# Patient Record
Sex: Male | Born: 1950 | Race: White | Hispanic: No | Marital: Married | Smoking: Former smoker
Health system: Southern US, Community
[De-identification: ages and names within clinical notes are randomized; demographics above are authoritative.]

## PROBLEM LIST (undated history)

## (undated) DIAGNOSIS — R600 Localized edema: Secondary | ICD-10-CM

## (undated) DIAGNOSIS — G43909 Migraine, unspecified, not intractable, without status migrainosus: Secondary | ICD-10-CM

## (undated) DIAGNOSIS — F419 Anxiety disorder, unspecified: Secondary | ICD-10-CM

## (undated) DIAGNOSIS — G4733 Obstructive sleep apnea (adult) (pediatric): Secondary | ICD-10-CM

## (undated) DIAGNOSIS — D649 Anemia, unspecified: Secondary | ICD-10-CM

## (undated) DIAGNOSIS — E039 Hypothyroidism, unspecified: Secondary | ICD-10-CM

## (undated) DIAGNOSIS — D696 Thrombocytopenia, unspecified: Secondary | ICD-10-CM

## (undated) DIAGNOSIS — K7210 Chronic hepatic failure without coma: Secondary | ICD-10-CM

## (undated) DIAGNOSIS — K7290 Hepatic failure, unspecified without coma: Secondary | ICD-10-CM

## (undated) DIAGNOSIS — R0602 Shortness of breath: Secondary | ICD-10-CM

## (undated) DIAGNOSIS — F329 Major depressive disorder, single episode, unspecified: Secondary | ICD-10-CM

## (undated) DIAGNOSIS — F1010 Alcohol abuse, uncomplicated: Secondary | ICD-10-CM

## (undated) DIAGNOSIS — M109 Gout, unspecified: Secondary | ICD-10-CM

## (undated) DIAGNOSIS — R188 Other ascites: Secondary | ICD-10-CM

## (undated) DIAGNOSIS — I10 Essential (primary) hypertension: Secondary | ICD-10-CM

## (undated) DIAGNOSIS — R51 Headache: Secondary | ICD-10-CM

## (undated) DIAGNOSIS — R519 Headache, unspecified: Secondary | ICD-10-CM

## (undated) DIAGNOSIS — K7460 Unspecified cirrhosis of liver: Secondary | ICD-10-CM

## (undated) DIAGNOSIS — F32A Depression, unspecified: Secondary | ICD-10-CM

## (undated) HISTORY — DX: Other ascites: R18.8

## (undated) HISTORY — PX: US THORA/PARACENTESIS: HXRAD580

## (undated) HISTORY — DX: Thrombocytopenia, unspecified: D69.6

## (undated) HISTORY — DX: Morbid (severe) obesity due to excess calories: E66.01

## (undated) HISTORY — DX: Chronic hepatic failure without coma: K72.10

## (undated) HISTORY — DX: Obstructive sleep apnea (adult) (pediatric): G47.33

## (undated) HISTORY — DX: Hepatic failure, unspecified without coma: K72.90

## (undated) HISTORY — DX: Gout, unspecified: M10.9

## (undated) HISTORY — DX: Localized edema: R60.0

## (undated) HISTORY — DX: Alcohol abuse, uncomplicated: F10.10

## (undated) HISTORY — DX: Anemia, unspecified: D64.9

## (undated) HISTORY — DX: Essential (primary) hypertension: I10

## (undated) HISTORY — PX: LAPAROSCOPIC GASTRIC BANDING: SHX1100

## (undated) HISTORY — DX: Unspecified cirrhosis of liver: K74.60

---

## 1970-02-03 HISTORY — PX: HUMERUS FRACTURE SURGERY: SHX670

## 1980-09-03 HISTORY — PX: KNEE ARTHROSCOPY: SHX127

## 2000-07-02 ENCOUNTER — Encounter: Payer: Self-pay | Admitting: Emergency Medicine

## 2000-07-02 ENCOUNTER — Emergency Department (HOSPITAL_COMMUNITY): Admission: EM | Admit: 2000-07-02 | Discharge: 2000-07-02 | Payer: Self-pay | Admitting: Emergency Medicine

## 2010-03-07 ENCOUNTER — Ambulatory Visit (HOSPITAL_COMMUNITY): Payer: Self-pay

## 2011-06-04 HISTORY — PX: PATELLA FRACTURE SURGERY: SHX735

## 2012-10-22 ENCOUNTER — Other Ambulatory Visit: Payer: Self-pay | Admitting: *Deleted

## 2012-10-22 MED ORDER — OXYCODONE HCL 5 MG PO TABS: ORAL_TABLET | ORAL | Status: DC

## 2012-10-22 MED ORDER — ZOLPIDEM TARTRATE 5 MG PO TABS: 5.0000 mg | ORAL_TABLET | Freq: Every evening | ORAL | Status: DC | PRN

## 2012-10-23 ENCOUNTER — Non-Acute Institutional Stay (SKILLED_NURSING_FACILITY): Payer: PRIVATE HEALTH INSURANCE | Admitting: Internal Medicine

## 2012-10-23 DIAGNOSIS — K7460 Unspecified cirrhosis of liver: Secondary | ICD-10-CM

## 2012-10-23 DIAGNOSIS — R609 Edema, unspecified: Secondary | ICD-10-CM

## 2012-10-23 DIAGNOSIS — R188 Other ascites: Secondary | ICD-10-CM

## 2012-10-25 ENCOUNTER — Non-Acute Institutional Stay (SKILLED_NURSING_FACILITY): Payer: PRIVATE HEALTH INSURANCE | Admitting: Internal Medicine

## 2012-10-25 ENCOUNTER — Non-Acute Institutional Stay: Payer: PRIVATE HEALTH INSURANCE | Admitting: Internal Medicine

## 2012-10-25 DIAGNOSIS — R109 Unspecified abdominal pain: Secondary | ICD-10-CM

## 2012-10-25 DIAGNOSIS — R188 Other ascites: Secondary | ICD-10-CM

## 2012-10-25 DIAGNOSIS — L0390 Cellulitis, unspecified: Secondary | ICD-10-CM

## 2012-10-25 DIAGNOSIS — K7460 Unspecified cirrhosis of liver: Secondary | ICD-10-CM

## 2012-10-25 DIAGNOSIS — R609 Edema, unspecified: Secondary | ICD-10-CM

## 2012-10-26 ENCOUNTER — Other Ambulatory Visit: Payer: Self-pay

## 2012-10-26 MED ORDER — OXYCODONE HCL 5 MG PO TABS: ORAL_TABLET | ORAL | Status: DC

## 2012-10-26 NOTE — Telephone Encounter (Signed)
Verified dose and instructions reflect manual request received by nursing home.   

## 2012-10-29 NOTE — Progress Notes (Addendum)
Patient ID: Shawn Wallace, male   DOB: 1950/02/23, 62 y.o.   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 He was admitted with abdominal pain and distention. He had previously had a paracentesis in April 2014. He underwent a therapeutic non-diagnostic abdominal paracentesis again on September 10th. Eleven liters of fluid were removed, followed by albumin infusion. He tolerated this well. He was started on lactulose and Lasix. He was put on sodium restriction. He followed up with GI, who did a repeat large volume paracentesis with 15 L of fluid removed on September 7th. His aldactone was increased to 100 and Lasix 40 mg.   PAST MEDICAL HISTORY/PROBLEM LIST:  Acute-on-chronic ascites secondary to end-stage liver disease, status post paracentesis x2.  Liver cirrhosis, likely secondary to chronic alcohol abuse.  Lower extremity edema, status post IV diuretic use.  History of gout.  History of hypertension.  Morbid obesity.  Obstructive sleep apnea.  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  Anemia of chronic disease.  CURRENT MEDICATIONS:  Discharge medications include:   Lasix 40 a day.   Lactulose 15 cc b.i.d.   Propranolol 80 b.i.d.   Spironolactone 100 daily.   Allopurinol 300 mg daily (presumably gout).   Ferrous sulfate 325 b.i.d.   Synthroid 25 a day.   Omeprazole 20 b.i.d.   Zoloft 200 a day.   Zolpidem 5 mg q.h.s. p.r.n.   Oxycodone 5 mg, 1 tablet daily p.r.n.   Amlodipine 5 mg daily.     SOCIAL HISTORY: HOUSING: The patient tells me he lives in [place] with family members.  FUNCTIONAL STATUS: He is able to stand and walk.   FAMILY HISTORY:  None available.    REVIEW OF SYSTEMS:   CHEST/RESPIRATORY: Some exertional shortness  of breath.  CARDIAC: No chest pain.  GI: His abdomen is somewhat sore, he says, but nausea or vomiting.  GU: No dysuria.   PHYSICAL EXAMINATION:   GENERAL APPEARANCE: The patient is not in any distress.  HEENT:  MOUTH/THROAT: A few remaining teeth. No lesions.  CHEST/RESPIRATORY: Clear air entry bilaterally.  CARDIOVASCULAR:  CARDIAC: Heart sounds are normal. There are no murmurs.  GASTROINTESTINAL:  ABDOMEN: Noticeably distended. He probably does have some remaining ascites, although I think this partially might be an ileus. HERNIA:  He also has a large ventral hernia.    GENITOURINARY:  Not distended.  There is no CVA tenderness.   EDEMA/VARICOSITIES:  No edema.  No evidence of heart failure.     ASSESSMENT/PLAN:  End-stage liver disease with cirrhosis.   He does not have any other findings at this point other than massive abdominal distention.  As mentioned, I think he has some ascites remaining.  However, I would not be surprised if he has a chronic ileus.  His abdomen is distended, but nontender.   Noted are the complications of portal hypertension including esophageal varices, history of encephalopathy, etc.  Lower extremity edema.  I would agree with the discharging physician.  He appears to be euvolemic.  He will need to be weighed frequently and his diuretics adjusted accordingly.   He is on No Added Salt diet here.  That may not be sufficient, although I think it is the best we  can do.    Hypothyroidism.  On replacement.     The patient does not appear to be encephalopathic at present.  He has no peripheral edema, although I think he is reaccumulating ascites.  We will check his lab work for next week.    CPT CODE: 16109

## 2012-11-02 ENCOUNTER — Encounter: Payer: Self-pay | Admitting: Adult Health

## 2012-11-02 ENCOUNTER — Non-Acute Institutional Stay (SKILLED_NURSING_FACILITY): Payer: PRIVATE HEALTH INSURANCE | Admitting: Adult Health

## 2012-11-02 DIAGNOSIS — M109 Gout, unspecified: Secondary | ICD-10-CM

## 2012-11-02 DIAGNOSIS — R609 Edema, unspecified: Secondary | ICD-10-CM

## 2012-11-02 DIAGNOSIS — F329 Major depressive disorder, single episode, unspecified: Secondary | ICD-10-CM

## 2012-11-02 DIAGNOSIS — G8929 Other chronic pain: Secondary | ICD-10-CM | POA: Insufficient documentation

## 2012-11-02 DIAGNOSIS — D638 Anemia in other chronic diseases classified elsewhere: Secondary | ICD-10-CM | POA: Insufficient documentation

## 2012-11-02 DIAGNOSIS — K7460 Unspecified cirrhosis of liver: Secondary | ICD-10-CM

## 2012-11-02 DIAGNOSIS — K7290 Hepatic failure, unspecified without coma: Secondary | ICD-10-CM | POA: Insufficient documentation

## 2012-11-02 DIAGNOSIS — K219 Gastro-esophageal reflux disease without esophagitis: Secondary | ICD-10-CM | POA: Insufficient documentation

## 2012-11-02 DIAGNOSIS — R188 Other ascites: Secondary | ICD-10-CM

## 2012-11-02 DIAGNOSIS — I10 Essential (primary) hypertension: Secondary | ICD-10-CM

## 2012-11-02 DIAGNOSIS — K769 Liver disease, unspecified: Secondary | ICD-10-CM

## 2012-11-02 DIAGNOSIS — E039 Hypothyroidism, unspecified: Secondary | ICD-10-CM

## 2012-11-02 NOTE — Assessment & Plan Note (Signed)
He has 3-4+ pitting edema bilaterally present; will increase his lasix to 40 mg twice daily and will monitor his status

## 2012-11-02 NOTE — Assessment & Plan Note (Signed)
Will continue iron twice daily and will monitor

## 2012-11-02 NOTE — Assessment & Plan Note (Signed)
Will continue allopurinol 300 mg daily

## 2012-11-02 NOTE — Assessment & Plan Note (Signed)
His emotional state is stable will continue his zoloft 200 mg daily

## 2012-11-02 NOTE — Assessment & Plan Note (Signed)
He continues to have bilateral shoulder pain and bilateral lower extremity pain will change his oxycodone to 5 mg every 6 hours for one week then every 6 hours as needed and will monitor his status

## 2012-11-02 NOTE — Assessment & Plan Note (Signed)
He is without change in status; will continue his lactulose 15 cc twice daily and will monitor his ammonia level

## 2012-11-02 NOTE — Assessment & Plan Note (Signed)
Will continue synthroid 25 mcg daily

## 2012-11-02 NOTE — Assessment & Plan Note (Signed)
Will continue prilosec 20 mg twice daily

## 2012-11-02 NOTE — Progress Notes (Signed)
Patient ID: Shawn Wallace, male   DOB: Jan 20, 1951, 62 y.o.   MRN: [id number]  [facility]  Allergies  Allergen Reactions  . Penicillins      Chief Complaint  Patient presents with  . Acute Visit    pain management     HPI: He is being seen for the management of his chronic pain. He is having increase bilateral shoulder pain and bilateral lower extremity pain. He states the oxycodone does help with his pain relief but is not available often enough. He states prior to his hospitalization he was trying to cut back on the use of his oxycodone.   Past Medical History  Diagnosis Date  . Ascites   . End-stage liver disease   . Liver cirrhosis   . Chronic alcohol abuse   . Edema extremities   . Gout   . Hypertension   . Morbid obesity   . Obstructive sleep apnea   . Thrombocytopenia   . Anemia     Past Surgical History  Procedure Laterality Date  . [place] thora/paracentesis      11 liters    VITAL SIGNS BP 120/68  Pulse 68  Ht 5\' 11"  (1.803 m)  Wt 270 lb (122.471 kg)  BMI 37.67 kg/m2   Patient's Medications  New Prescriptions   No medications on file  Previous Medications   AMLODIPINE (NORVASC) 5 MG TABLET    Take 5 mg by mouth daily.   FERROUS GLUCONATE (FERGON) 325 MG TABLET    Take 325 mg by mouth 2 (two) times daily.    FUROSEMIDE (LASIX) 40 MG TABLET    Take 40 mg by mouth daily.   LACTULOSE, ENCEPHALOPATHY, (CHRONULAC) 10 GM/15ML SOLN    Take 10 g by mouth 2 (two) times daily.   LEVOTHYROXINE (SYNTHROID, LEVOTHROID) 25 MCG TABLET    Take 25 mcg by mouth daily before breakfast.   OMEPRAZOLE (PRILOSEC) 20 MG CAPSULE    Take 20 mg by mouth 2 (two) times daily.   OXYCODONE (OXY IR/ROXICODONE) 5 MG IMMEDIATE RELEASE TABLET    1 by mouth twice a day as needed for pain   PROPRANOLOL (INDERAL) 80 MG TABLET    Take 80 mg by mouth 2 (two) times daily.   SERTRALINE (ZOLOFT) 100 MG TABLET    Take 200 mg by mouth daily.   SPIRONOLACTONE (ALDACTONE) 100 MG TABLET    Take 100  mg by mouth daily.   ZOLPIDEM (AMBIEN) 5 MG TABLET    Take 1 tablet (5 mg total) by mouth at bedtime as needed for sleep.  Modified Medications   No medications on file  Discontinued Medications   No medications on file    SIGNIFICANT DIAGNOSTIC EXAMS    LABS REVIEWED:   10-28-12: wbc 4.7; hgb 11.4; hct 38.7; mcv 87; plt 92; glucose 75; bun 15; creat 0.87; k+4.1; na++137    Review of Systems  Constitutional: Positive for malaise/fatigue.  Respiratory: Negative for cough and shortness of breath.   Cardiovascular: Positive for leg swelling. Negative for chest pain.  Gastrointestinal: Negative for heartburn, abdominal pain and constipation.  Musculoskeletal: Positive for myalgias, back pain and joint pain.  Skin: Negative.   Neurological: Negative for headaches.  Psychiatric/Behavioral: Negative for depression. The patient does not have insomnia.     Physical Exam  Constitutional: He is oriented to person, place, and time. He appears well-developed and well-nourished.  Morbidly obese  Neck: No JVD present. No thyromegaly present.  Cardiovascular: Normal rate, regular  rhythm and intact distal pulses.   Respiratory: Effort normal and breath sounds normal. No respiratory distress.  GI: Soft. Bowel sounds are normal.  Has severe ascites present   Musculoskeletal: Normal range of motion. He exhibits edema.  Has 3-4+ bilateral lower extremity pitting edema present   Neurological: He is alert and oriented to person, place, and time.  Skin: Skin is warm and dry.  Psychiatric: He has a normal mood and affect.      ASSESSMENT/ PLAN:  Essential hypertension, benign He is stable will continue norvasc 5 mg daily and propranolol 80 mg twice daily and will monitor his status  GERD (gastroesophageal reflux disease) Will continue prilosec 20 mg twice daily   End-stage liver disease He is without change in status; will continue his lactulose 15 cc twice daily and will monitor his  ammonia level   Hypothyroidism Will continue synthroid 25 mcg daily   Gout Will continue allopurinol 300 mg daily   Edema He has 3-4+ pitting edema bilaterally present; will increase his lasix to 40 mg twice daily and will monitor his status   Depression His emotional state is stable will continue his zoloft 200 mg daily   Chronic pain He continues to have bilateral shoulder pain and bilateral lower extremity pain will change his oxycodone to 5 mg every 6 hours for one week then every 6 hours as needed and will monitor his status   Ascites He continue to have severe ascites; will continue his spironolactone 100 mg daily and will monitor his status   Anemia of chronic disease Will continue iron twice daily and will monitor    in one week will check bmp; ammonia; tsh  Time spent with patient 50 minutes.

## 2012-11-02 NOTE — Assessment & Plan Note (Signed)
He continue to have severe ascites; will continue his spironolactone 100 mg daily and will monitor his status

## 2012-11-02 NOTE — Assessment & Plan Note (Signed)
He is stable will continue norvasc 5 mg daily and propranolol 80 mg twice daily and will monitor his status

## 2012-11-08 ENCOUNTER — Non-Acute Institutional Stay (SKILLED_NURSING_FACILITY): Payer: PRIVATE HEALTH INSURANCE | Admitting: Internal Medicine

## 2012-11-08 DIAGNOSIS — K7460 Unspecified cirrhosis of liver: Secondary | ICD-10-CM

## 2012-11-08 DIAGNOSIS — D638 Anemia in other chronic diseases classified elsewhere: Secondary | ICD-10-CM

## 2012-11-08 DIAGNOSIS — E039 Hypothyroidism, unspecified: Secondary | ICD-10-CM

## 2012-11-08 DIAGNOSIS — G8929 Other chronic pain: Secondary | ICD-10-CM

## 2012-11-09 ENCOUNTER — Encounter (HOSPITAL_COMMUNITY): Payer: Self-pay | Admitting: General Practice

## 2012-11-09 ENCOUNTER — Inpatient Hospital Stay (HOSPITAL_COMMUNITY)
Admission: EM | Admit: 2012-11-09 | Discharge: 2012-11-14 | DRG: 948 | Disposition: A | Discharge status: Skilled Nursing Facility | Payer: Non-veteran care | Attending: Internal Medicine | Admitting: Internal Medicine

## 2012-11-09 DIAGNOSIS — F3289 Other specified depressive episodes: Secondary | ICD-10-CM | POA: Diagnosis present

## 2012-11-09 DIAGNOSIS — I4891 Unspecified atrial fibrillation: Secondary | ICD-10-CM | POA: Diagnosis not present

## 2012-11-09 DIAGNOSIS — D696 Thrombocytopenia, unspecified: Secondary | ICD-10-CM | POA: Diagnosis present

## 2012-11-09 DIAGNOSIS — R7989 Other specified abnormal findings of blood chemistry: Secondary | ICD-10-CM

## 2012-11-09 DIAGNOSIS — M109 Gout, unspecified: Secondary | ICD-10-CM | POA: Diagnosis present

## 2012-11-09 DIAGNOSIS — I480 Paroxysmal atrial fibrillation: Secondary | ICD-10-CM

## 2012-11-09 DIAGNOSIS — K7030 Alcoholic cirrhosis of liver without ascites: Secondary | ICD-10-CM | POA: Diagnosis present

## 2012-11-09 DIAGNOSIS — R609 Edema, unspecified: Secondary | ICD-10-CM

## 2012-11-09 DIAGNOSIS — L03116 Cellulitis of left lower limb: Secondary | ICD-10-CM

## 2012-11-09 DIAGNOSIS — K7290 Hepatic failure, unspecified without coma: Secondary | ICD-10-CM

## 2012-11-09 DIAGNOSIS — F1020 Alcohol dependence, uncomplicated: Secondary | ICD-10-CM | POA: Diagnosis present

## 2012-11-09 DIAGNOSIS — G8929 Other chronic pain: Secondary | ICD-10-CM

## 2012-11-09 DIAGNOSIS — L0390 Cellulitis, unspecified: Secondary | ICD-10-CM

## 2012-11-09 DIAGNOSIS — F411 Generalized anxiety disorder: Secondary | ICD-10-CM | POA: Diagnosis present

## 2012-11-09 DIAGNOSIS — Z87891 Personal history of nicotine dependence: Secondary | ICD-10-CM

## 2012-11-09 DIAGNOSIS — K7460 Unspecified cirrhosis of liver: Secondary | ICD-10-CM

## 2012-11-09 DIAGNOSIS — F329 Major depressive disorder, single episode, unspecified: Secondary | ICD-10-CM | POA: Diagnosis present

## 2012-11-09 DIAGNOSIS — L02619 Cutaneous abscess of unspecified foot: Secondary | ICD-10-CM

## 2012-11-09 DIAGNOSIS — Z23 Encounter for immunization: Secondary | ICD-10-CM

## 2012-11-09 DIAGNOSIS — K769 Liver disease, unspecified: Secondary | ICD-10-CM | POA: Diagnosis present

## 2012-11-09 DIAGNOSIS — G4733 Obstructive sleep apnea (adult) (pediatric): Secondary | ICD-10-CM | POA: Diagnosis present

## 2012-11-09 DIAGNOSIS — I10 Essential (primary) hypertension: Secondary | ICD-10-CM | POA: Diagnosis present

## 2012-11-09 DIAGNOSIS — D638 Anemia in other chronic diseases classified elsewhere: Secondary | ICD-10-CM | POA: Diagnosis present

## 2012-11-09 DIAGNOSIS — E039 Hypothyroidism, unspecified: Secondary | ICD-10-CM | POA: Diagnosis present

## 2012-11-09 DIAGNOSIS — R188 Other ascites: Principal | ICD-10-CM

## 2012-11-09 DIAGNOSIS — F1021 Alcohol dependence, in remission: Secondary | ICD-10-CM

## 2012-11-09 DIAGNOSIS — G43909 Migraine, unspecified, not intractable, without status migrainosus: Secondary | ICD-10-CM | POA: Diagnosis present

## 2012-11-09 HISTORY — DX: Shortness of breath: R06.02

## 2012-11-09 HISTORY — DX: Headache: R51

## 2012-11-09 HISTORY — DX: Hypothyroidism, unspecified: E03.9

## 2012-11-09 HISTORY — DX: Depression, unspecified: F32.A

## 2012-11-09 HISTORY — DX: Headache, unspecified: R51.9

## 2012-11-09 HISTORY — DX: Migraine, unspecified, not intractable, without status migrainosus: G43.909

## 2012-11-09 HISTORY — DX: Major depressive disorder, single episode, unspecified: F32.9

## 2012-11-09 HISTORY — DX: Anxiety disorder, unspecified: F41.9

## 2012-11-09 LAB — BASIC METABOLIC PANEL
CO2: 29 mEq/L (ref 19–32)
Calcium: 8.5 mg/dL (ref 8.4–10.5)
Chloride: 99 mEq/L (ref 96–112)
Creatinine, Ser: 0.89 mg/dL (ref 0.50–1.35)
GFR calc non Af Amer: 89 mL/min — ABNORMAL LOW (ref >90)
Glucose, Bld: 77 mg/dL (ref 70–99)
Potassium: 4 mEq/L (ref 3.5–5.1)

## 2012-11-09 LAB — CBC WITH DIFFERENTIAL/PLATELET
Basophils Relative: 2 % — ABNORMAL HIGH (ref 0–1)
Eosinophils Absolute: 0.2 10*3/uL (ref 0.0–0.7)
HCT: 34.7 % — ABNORMAL LOW (ref 39.0–52.0)
Hemoglobin: 11.2 g/dL — ABNORMAL LOW (ref 13.0–17.0)
Lymphs Abs: 0.6 10*3/uL — ABNORMAL LOW (ref 0.7–4.0)
MCH: 28 pg (ref 26.0–34.0)
MCHC: 32.3 g/dL (ref 30.0–36.0)
MCV: 86.8 fL (ref 78.0–100.0)
Monocytes Absolute: 0.6 10*3/uL (ref 0.1–1.0)
Monocytes Relative: 13 % — ABNORMAL HIGH (ref 3–12)
RBC: 4 MIL/uL — ABNORMAL LOW (ref 4.22–5.81)

## 2012-11-09 LAB — APTT: aPTT: 33 seconds (ref 24–37)

## 2012-11-09 LAB — CG4 I-STAT (LACTIC ACID): Lactic Acid, Venous: 1.39 mmol/L (ref 0.5–2.2)

## 2012-11-09 LAB — HEPATIC FUNCTION PANEL
ALT: 16 U/L (ref 0–53)
Albumin: 2.8 g/dL — ABNORMAL LOW (ref 3.5–5.2)
Alkaline Phosphatase: 98 U/L (ref 39–117)
Indirect Bilirubin: 0.5 mg/dL (ref 0.3–0.9)
Total Bilirubin: 0.7 mg/dL (ref 0.3–1.2)
Total Protein: 6.5 g/dL (ref 6.0–8.3)

## 2012-11-09 LAB — POCT I-STAT TROPONIN I

## 2012-11-09 LAB — PROTIME-INR
INR: 0.99 (ref 0.00–1.49)
Prothrombin Time: 12.9 seconds (ref 11.6–15.2)

## 2012-11-09 LAB — AMMONIA: Ammonia: 71 umol/L — ABNORMAL HIGH (ref 11–60)

## 2012-11-09 MED ORDER — AMLODIPINE BESYLATE 5 MG PO TABS
5.0000 mg | ORAL_TABLET | Freq: Every day | ORAL | Status: DC
  Administered 2012-11-10 – 2012-11-11 (×2): 5 mg via ORAL
  Filled 2012-11-09 (×3): qty 1

## 2012-11-09 MED ORDER — VANCOMYCIN HCL IN DEXTROSE 1-5 GM/200ML-% IV SOLN
1000.0000 mg | Freq: Once | INTRAVENOUS | Status: AC
  Administered 2012-11-09: 1000 mg via INTRAVENOUS
  Filled 2012-11-09: qty 200

## 2012-11-09 MED ORDER — CIPROFLOXACIN IN D5W 400 MG/200ML IV SOLN
400.0000 mg | Freq: Once | INTRAVENOUS | Status: AC
  Administered 2012-11-09: 400 mg via INTRAVENOUS
  Filled 2012-11-09: qty 200

## 2012-11-09 MED ORDER — FUROSEMIDE 40 MG PO TABS
40.0000 mg | ORAL_TABLET | Freq: Every day | ORAL | Status: DC
  Administered 2012-11-10 – 2012-11-11 (×2): 40 mg via ORAL
  Filled 2012-11-09 (×3): qty 1

## 2012-11-09 MED ORDER — ACETAMINOPHEN 325 MG PO TABS
650.0000 mg | ORAL_TABLET | Freq: Four times a day (QID) | ORAL | Status: DC | PRN
Start: 2012-11-09 — End: 2012-11-14
  Filled 2012-11-09: qty 2

## 2012-11-09 MED ORDER — FERROUS GLUCONATE 324 (38 FE) MG PO TABS
325.0000 mg | ORAL_TABLET | Freq: Two times a day (BID) | ORAL | Status: DC
  Administered 2012-11-10 (×2): 324 mg via ORAL
  Administered 2012-11-11 – 2012-11-14 (×7): 325 mg via ORAL
  Filled 2012-11-09 (×11): qty 1

## 2012-11-09 MED ORDER — SODIUM CHLORIDE 0.9 % IJ SOLN
3.0000 mL | Freq: Two times a day (BID) | INTRAMUSCULAR | Status: DC
  Administered 2012-11-10: 3 mL via INTRAVENOUS

## 2012-11-09 MED ORDER — ONDANSETRON HCL 4 MG/2ML IJ SOLN: 4.0000 mg | Freq: Three times a day (TID) | INTRAMUSCULAR | Status: DC | PRN

## 2012-11-09 MED ORDER — OXYCODONE HCL 5 MG PO TABS
5.0000 mg | ORAL_TABLET | ORAL | Status: DC | PRN
  Administered 2012-11-09 (×2): 5 mg via ORAL
  Filled 2012-11-09 (×2): qty 1

## 2012-11-09 MED ORDER — SODIUM CHLORIDE 0.9 % IJ SOLN: 3.0000 mL | Freq: Two times a day (BID) | INTRAMUSCULAR | Status: DC

## 2012-11-09 MED ORDER — ONDANSETRON HCL 4 MG/2ML IJ SOLN: 4.0000 mg | Freq: Four times a day (QID) | INTRAMUSCULAR | Status: DC | PRN

## 2012-11-09 MED ORDER — PROPRANOLOL HCL 80 MG PO TABS
80.0000 mg | ORAL_TABLET | Freq: Two times a day (BID) | ORAL | Status: DC
  Administered 2012-11-10 – 2012-11-14 (×9): 80 mg via ORAL
  Filled 2012-11-09 (×12): qty 1

## 2012-11-09 MED ORDER — INFLUENZA VAC SPLIT QUAD 0.5 ML IM SUSP
0.5000 mL | INTRAMUSCULAR | Status: AC
  Administered 2012-11-10: 0.5 mL via INTRAMUSCULAR
  Filled 2012-11-09: qty 0.5

## 2012-11-09 MED ORDER — ACETAMINOPHEN 650 MG RE SUPP: 650.0000 mg | Freq: Four times a day (QID) | RECTAL | Status: DC | PRN

## 2012-11-09 MED ORDER — LACTULOSE 10 GM/15ML PO SOLN
20.0000 g | Freq: Three times a day (TID) | ORAL | Status: DC
  Administered 2012-11-10 – 2012-11-14 (×14): 20 g via ORAL
  Filled 2012-11-09 (×16): qty 30

## 2012-11-09 MED ORDER — SERTRALINE HCL 100 MG PO TABS
200.0000 mg | ORAL_TABLET | Freq: Every day | ORAL | Status: DC
  Administered 2012-11-10 – 2012-11-14 (×5): 200 mg via ORAL
  Filled 2012-11-09 (×5): qty 2

## 2012-11-09 MED ORDER — OXYCODONE HCL 5 MG PO TABS
5.0000 mg | ORAL_TABLET | Freq: Four times a day (QID) | ORAL | Status: DC | PRN
  Administered 2012-11-10 – 2012-11-14 (×17): 5 mg via ORAL
  Filled 2012-11-09 (×17): qty 1

## 2012-11-09 MED ORDER — SPIRONOLACTONE 100 MG PO TABS
100.0000 mg | ORAL_TABLET | Freq: Every day | ORAL | Status: DC
  Administered 2012-11-10 – 2012-11-11 (×2): 100 mg via ORAL
  Filled 2012-11-09 (×3): qty 1

## 2012-11-09 MED ORDER — ONDANSETRON HCL 4 MG PO TABS: 4.0000 mg | ORAL_TABLET | Freq: Four times a day (QID) | ORAL | Status: DC | PRN

## 2012-11-09 MED ORDER — LACTULOSE 10 GM/15ML PO SOLN
20.0000 g | Freq: Once | ORAL | Status: AC
  Administered 2012-11-09: 20 g via ORAL
  Filled 2012-11-09: qty 30

## 2012-11-09 MED ORDER — LEVOTHYROXINE SODIUM 25 MCG PO TABS
25.0000 ug | ORAL_TABLET | Freq: Every day | ORAL | Status: DC
  Administered 2012-11-10 – 2012-11-14 (×5): 25 ug via ORAL
  Filled 2012-11-09 (×6): qty 1

## 2012-11-09 MED ORDER — PANTOPRAZOLE SODIUM 40 MG PO TBEC
40.0000 mg | DELAYED_RELEASE_TABLET | Freq: Every day | ORAL | Status: DC
  Administered 2012-11-10 – 2012-11-14 (×5): 40 mg via ORAL
  Filled 2012-11-09 (×5): qty 1

## 2012-11-09 MED ORDER — ZOLPIDEM TARTRATE 5 MG PO TABS
5.0000 mg | ORAL_TABLET | Freq: Every evening | ORAL | Status: DC | PRN
  Administered 2012-11-11: 5 mg via ORAL
  Filled 2012-11-09: qty 1

## 2012-11-09 NOTE — ED Provider Notes (Signed)
See prior note   Ward Givens, MD 11/09/12 2025

## 2012-11-09 NOTE — Progress Notes (Signed)
ANTIBIOTIC CONSULT NOTE - INITIAL  Pharmacy Consult for Vancomycin and Cipro Indication: cellulitis and ascites  Allergies  Allergen Reactions  . Penicillins Hives    Patient Measurements: Height: 5\' 11"  (180.3 cm) Weight: 324 lb 12.8 oz (147.328 kg) IBW/kg (Calculated) : 75.3 Adjusted Body Weight: 105 kg  Vital Signs: Temp: 98.5 F (36.9 C) (10/07 2122) Temp src: Oral (10/07 2122) BP: 124/66 mmHg (10/07 2122) Pulse Rate: 64 (10/07 2122) Intake/Output from previous day:   Intake/Output from this shift: Total I/O In: 200 [IV Piggyback:200] Out: -   Labs:  Recent Labs  11/09/12 1657  WBC 4.7  HGB 11.2*  PLT 76*  CREATININE 0.89   Estimated Creatinine Clearance: 125.1 ml/min (by C-G formula based on Cr of 0.89). No results found for this basename: VANCOTROUGH, VANCOPEAK, VANCORANDOM, GENTTROUGH, GENTPEAK, GENTRANDOM, TOBRATROUGH, TOBRAPEAK, TOBRARND, AMIKACINPEAK, AMIKACINTROU, AMIKACIN,  in the last 72 hours   Microbiology: No results found for this or any previous visit (from the past 720 hour(s)).  Medical History: Past Medical History  Diagnosis Date  . Ascites   . End-stage liver disease   . Liver cirrhosis   . Chronic alcohol abuse   . Edema extremities   . Gout   . Hypertension   . Morbid obesity   . Thrombocytopenia   . Anemia   . Shortness of breath     "cause area around stomach full of fluid" (11/09/2012)  . Obstructive sleep apnea     denies using mask on 11/09/2012  . Hypothyroidism   . Daily headache   . Migraine     "~ q 2-3 weeks" (11/09/2012)  . Anxiety   . Depression     Medications:  Prescriptions prior to admission  Medication Sig Dispense Refill  . amLODipine (NORVASC) 5 MG tablet Take 5 mg by mouth daily.      . ferrous gluconate (FERGON) 325 MG tablet Take 325 mg by mouth 2 (two) times daily.       . furosemide (LASIX) 40 MG tablet Take 40 mg by mouth daily.      Marland Kitchen lactulose, encephalopathy, (CHRONULAC) 10 GM/15ML SOLN Take  10 g by mouth 2 (two) times daily.      Marland Kitchen levothyroxine (SYNTHROID, LEVOTHROID) 25 MCG tablet Take 25 mcg by mouth daily before breakfast.      . omeprazole (PRILOSEC) 20 MG capsule Take 20 mg by mouth 2 (two) times daily.      Marland Kitchen oxyCODONE (OXY IR/ROXICODONE) 5 MG immediate release tablet 1 by mouth twice a day as needed for pain  60 tablet  0  . propranolol (INDERAL) 80 MG tablet Take 80 mg by mouth 2 (two) times daily.      . sertraline (ZOLOFT) 100 MG tablet Take 200 mg by mouth daily.      Marland Kitchen spironolactone (ALDACTONE) 100 MG tablet Take 100 mg by mouth daily.      Marland Kitchen zolpidem (AMBIEN) 5 MG tablet Take 1 tablet (5 mg total) by mouth at bedtime as needed for sleep.  30 tablet  5   Assessment: 62 yo male with celluliis and ascites, possible SBP, for empiric antibiotics.  Vancomycin 1 g IV given in ED at 2000  Goal of Therapy:  Vancomycin trough level 10-15 mcg/ml  Plan:  Vancomycin 1 g IV q8h, first dose now. Cipro 400 mg IV q12h  Eddie Candle 11/09/2012,11:57 PM

## 2012-11-09 NOTE — ED Provider Notes (Signed)
Pt has alcoholic cirrhosis followed at the [facility]. Has been sober for 7 years. He c/o increasing redness and swelling in his lower legs over the past couple of days and increasing swelling of his abdomen. He is unaware of fever. He states his abdomen is so swollen he is having difficulty breathing. Pt recently went to [facility] for rehab to get his strenght to improve.   Pt has marked distended abdomen without rebound or guarding. He has mild diffuse swelling of of both lower legs with redness of the dorsum of his left foot with warmth, some redness of the distal lower extremities bilaterally with warmth on the left.    Medical screening examination/treatment/procedure(s) were conducted as a shared visit with non-physician practitioner(s) and myself.  I personally evaluated the patient during the encounter   Devoria Albe, MD, Franz Dell, MD 11/09/12 [phone]

## 2012-11-09 NOTE — H&P (Signed)
[facility] History and Physical  Shawn Wallace UYQ:[id number] DOB: December 06, 1950 DOA: 11/09/2012  Referring physician: ER physician. PCP: No primary provider on file.  Chief Complaint: Increasing ascites and lower extremity erythema.  HPI: Shawn Wallace is a 62 y.o. male was transferred from nursing home after patient was found to have increasing ascites with lower extremity edema. Patient has known history of alcoholic liver cirrhosis and has had large volume paracentesis, almost 11 L last month September 10. Subsequent to which patient has been admitted to nursing home. Patient's ascites has slowly increased. Patient also has experienced some abdominal discomfort due to ascites. Denies any fever chills nausea vomiting diarrhea. Last 3 days patient has noticed increasing redness and lower extremity mostly in the left dorsal aspect of the foot. There is also mild redness in the right foot. Patient has been admitted for further management. Presently patient is not in acute distress.  Review of Systems: As presented in the history of presenting illness, rest negative.  Past Medical History  Diagnosis Date  . Ascites   . End-stage liver disease   . Liver cirrhosis   . Chronic alcohol abuse   . Edema extremities   . Gout   . Hypertension   . Morbid obesity   . Thrombocytopenia   . Anemia   . Shortness of breath     "cause area around stomach full of fluid" (11/09/2012)  . Obstructive sleep apnea     denies using mask on 11/09/2012  . Hypothyroidism   . Daily headache   . Migraine     "~ q 2-3 weeks" (11/09/2012)  . Anxiety   . Depression    Past Surgical History  Procedure Laterality Date  . [place] thora/paracentesis      11 liters  . Knee arthroscopy Right 09/1980  . Patella fracture surgery Left 06/2011    "blister under kneecap; had to open it up to get the jagged edges off" (11/09/2012)  . Laparoscopic gastric banding  ?08/2005    "for stomach ulcers" (11/09/2012)  . Humerus  fracture surgery Left 1972    "compound fracture" (11/09/2012)   Social History:  reports that he quit smoking about 4 weeks ago. His smoking use included Cigarettes. He has a 10.75 pack-year smoking history. He has never used smokeless tobacco. He reports that  drinks alcohol. He reports that he does not use illicit drugs. Nursing home. where does patient live-- Yes. Can patient participate in ADLs?  Allergies  Allergen Reactions  . Penicillins Hives    Family History  Problem Relation Age of Onset  . Brain cancer Brother       Prior to Admission medications   Medication Sig Start Date End Date Taking? Authorizing Provider  amLODipine (NORVASC) 5 MG tablet Take 5 mg by mouth daily.   Yes Historical Provider, MD  ferrous gluconate (FERGON) 325 MG tablet Take 325 mg by mouth 2 (two) times daily.    Yes Historical Provider, MD  furosemide (LASIX) 40 MG tablet Take 40 mg by mouth daily.   Yes Historical Provider, MD  lactulose, encephalopathy, (CHRONULAC) 10 GM/15ML SOLN Take 10 g by mouth 2 (two) times daily.   Yes Historical Provider, MD  levothyroxine (SYNTHROID, LEVOTHROID) 25 MCG tablet Take 25 mcg by mouth daily before breakfast.   Yes Historical Provider, MD  omeprazole (PRILOSEC) 20 MG capsule Take 20 mg by mouth 2 (two) times daily.   Yes Historical Provider, MD  oxyCODONE (OXY IR/ROXICODONE) 5 MG immediate release  tablet 1 by mouth twice a day as needed for pain 10/26/12  Yes Claudie Revering, NP  propranolol (INDERAL) 80 MG tablet Take 80 mg by mouth 2 (two) times daily.   Yes Historical Provider, MD  sertraline (ZOLOFT) 100 MG tablet Take 200 mg by mouth daily.   Yes Historical Provider, MD  spironolactone (ALDACTONE) 100 MG tablet Take 100 mg by mouth daily.   Yes Historical Provider, MD  zolpidem (AMBIEN) 5 MG tablet Take 1 tablet (5 mg total) by mouth at bedtime as needed for sleep. 10/22/12  Yes Kermit Balo, DO   Physical Exam: Filed Vitals:   11/09/12 1930 11/09/12 2015  11/09/12 2030 11/09/12 2122  BP: 103/56 118/60 121/62 124/66  Pulse: 63 64 61 64  Temp:    98.5 F (36.9 C)  TempSrc:    Oral  Resp: 20 19 21 22   Height:    5\' 11"  (1.803 m)  Weight:    147.328 kg (324 lb 12.8 oz)  SpO2: 99% 97% 97% 97%     General:  Well-developed well-nourished.  Eyes: Anicteric no pallor.  ENT: No discharge from ears eyes nose mouth.  Neck: No mass felt.  Cardiovascular: S1-S2 heard.  Respiratory: No rhonchi or crepitations.  Abdomen: Distended abdomen. Nontender. Bowel sounds present. No guarding or rigidity.  Skin: Erythema on the dorsal aspect of the left foot and also on the dorsal aspect of her right foot.  Musculoskeletal: Bilateral extensive edema.  Psychiatric: Appears normal.  Neurologic: Alert awake oriented to time place and person. Moves all extremities.  Labs on Admission:  Basic Metabolic Panel:  Recent Labs Lab 11/09/12 1657  NA 135  K 4.0  CL 99  CO2 29  GLUCOSE 77  BUN 12  CREATININE 0.89  CALCIUM 8.5   Liver Function Tests:  Recent Labs Lab 11/09/12 1657  AST 27  ALT 16  ALKPHOS 98  BILITOT 0.7  PROT 6.5  ALBUMIN 2.8*   No results found for this basename: LIPASE, AMYLASE,  in the last 168 hours  Recent Labs Lab 11/09/12 1657  AMMONIA 71*   CBC:  Recent Labs Lab 11/09/12 1657  WBC 4.7  NEUTROABS 3.1  HGB 11.2*  HCT 34.7*  MCV 86.8  PLT 76*   Cardiac Enzymes: No results found for this basename: CKTOTAL, CKMB, CKMBINDEX, TROPONINI,  in the last 168 hours  BNP (last 3 results) No results found for this basename: PROBNP,  in the last 8760 hours CBG: No results found for this basename: GLUCAP,  in the last 168 hours  Radiological Exams on Admission: No results found.   Assessment/Plan Principal Problem:   Ascites Active Problems:   Cirrhosis of liver   Hypothyroidism   Anemia of chronic disease   Cellulitis of left foot   1. Ascites in a patient with known history of alcoholic liver  cirrhosis - both diagnostic and therapeutic paracentesis has been ordered. Patient is on Lasix and spironolactone which will be continued and probably the dose may have to be increased. Closely follow intake output and metabolic panel. Patient may need albumin infusion if large volume paracentesis is planned. Patient's ammonia levels are mildly elevated for which I have increased patient's lactulose dose. Patient is also empiric antibiotics until paracentesis results are available. 2. Cellulitis of the lower extremities - patient has been placed on vancomycin in addition to Cipro. 3. Chronic lower extremity edema secondary to decompensated cirrhosis - continue with diuretics. Check Dopplers to rule out DVT.  4. Hypothyroidism - continue Synthroid check TSH. 5. Chronic anemia - follow CBC. 6. Chronic thrombocytopenia - probably from cirrhosis of liver. 7. History of alcoholism - patient not had alcohol for more than 7 years. 8. Hypertension - continue amlodipine.    Code Status: Full code.  Family Communication: None.  Disposition Plan: Admit to inpatient.    KAKRAKANDY,ARSHAD N. [facility] Pager [phone].  If 7PM-7AM, please contact night-coverage [email or web address] Password [email or web address] 11/09/2012, 11:55 PM

## 2012-11-09 NOTE — Progress Notes (Signed)
Patient arrived to 6N24 via stretcher from ED. Alert and oriented. Denies pain. Ambulated to bathroom. Oriented to call light and room. MD paged for admission orders.

## 2012-11-09 NOTE — ED Provider Notes (Signed)
CSN: [id number]     Arrival date & time 11/09/12  1559 History   First MD Initiated Contact with Patient 11/09/12 1601     Chief Complaint  Patient presents with  . Weight Gain   (Consider location/radiation/quality/duration/timing/severity/associated sxs/prior Treatment) HPI Shawn Wallace is a 62 y.o. male who presents to ED with complaint of abdominal pain, distention, lower leg pain. Pt with end-stage disease, liver cirrhosis, a history of alcohol abuse. He is coming from assisted living facility. He states that his abdomen is distended chronically. States also has chronic lower leg swelling however denies any redness in the past. Patient states that his redness just began last 3 days. States he was seen in [place] just 2 weeks ago and had fluid drawn from his abdomen. Patient states that his symptoms improved however fluid started quickly building back up. Patient denies any fever or chills. Patient does state how malaise. States that he has history of elevated ammonia is currently on lactulose. He is taking Lasix and is brown and walked on it for his fluid in his abdomen. Ends are worsening. Past Medical History  Diagnosis Date  . Ascites   . End-stage liver disease   . Liver cirrhosis   . Chronic alcohol abuse   . Edema extremities   . Gout   . Hypertension   . Morbid obesity   . Obstructive sleep apnea   . Thrombocytopenia   . Anemia    Past Surgical History  Procedure Laterality Date  . [place] thora/paracentesis      11 liters   Family History  Problem Relation Age of Onset  . Family history unknown: Yes   History  Substance Use Topics  . Smoking status: Unknown If Ever Smoked  . Smokeless tobacco: Not on file  . Alcohol Use: Yes    Review of Systems  Constitutional: Negative for fever and chills.  HENT: Negative for neck pain and neck stiffness.   Respiratory: Negative for cough, chest tightness and shortness of breath.   Cardiovascular: Positive for leg swelling.  Negative for chest pain and palpitations.  Gastrointestinal: Positive for abdominal pain and abdominal distention. Negative for nausea, vomiting and diarrhea.  Genitourinary: Negative for dysuria, urgency, frequency and hematuria.  Musculoskeletal: Positive for myalgias and arthralgias.  Skin: Negative for rash.  Allergic/Immunologic: Negative for immunocompromised state.  Neurological: Negative for dizziness, weakness, light-headedness, numbness and headaches.    Allergies  Penicillins  Home Medications   Current Outpatient Rx  Name  Route  Sig  Dispense  Refill  . amLODipine (NORVASC) 5 MG tablet   Oral   Take 5 mg by mouth daily.         . ferrous gluconate (FERGON) 325 MG tablet   Oral   Take 325 mg by mouth 2 (two) times daily.          . furosemide (LASIX) 40 MG tablet   Oral   Take 40 mg by mouth daily.         Marland Kitchen lactulose, encephalopathy, (CHRONULAC) 10 GM/15ML SOLN   Oral   Take 10 g by mouth 2 (two) times daily.         Marland Kitchen levothyroxine (SYNTHROID, LEVOTHROID) 25 MCG tablet   Oral   Take 25 mcg by mouth daily before breakfast.         . omeprazole (PRILOSEC) 20 MG capsule   Oral   Take 20 mg by mouth 2 (two) times daily.         Marland Kitchen  oxyCODONE (OXY IR/ROXICODONE) 5 MG immediate release tablet      1 by mouth twice a day as needed for pain   60 tablet   0   . propranolol (INDERAL) 80 MG tablet   Oral   Take 80 mg by mouth 2 (two) times daily.         . sertraline (ZOLOFT) 100 MG tablet   Oral   Take 200 mg by mouth daily.         Marland Kitchen spironolactone (ALDACTONE) 100 MG tablet   Oral   Take 100 mg by mouth daily.         Marland Kitchen zolpidem (AMBIEN) 5 MG tablet   Oral   Take 1 tablet (5 mg total) by mouth at bedtime as needed for sleep.   30 tablet   5    BP 121/66  Pulse 67  Temp(Src) 98.1 F (36.7 C) (Oral)  Resp 23  SpO2 99% Physical Exam  Nursing note and vitals reviewed. Constitutional: He is oriented to person, place, and time.  He appears well-developed and well-nourished. No distress.  HENT:  Head: Normocephalic and atraumatic.  Eyes: Conjunctivae are normal. No scleral icterus.  Neck: Neck supple.  Cardiovascular: Normal rate, regular rhythm and normal heart sounds.   Pulmonary/Chest: Effort normal. No respiratory distress. He has no wheezes. He has no rales.  Abdominal: Soft. Bowel sounds are normal. He exhibits distension. There is tenderness. There is no rebound and no guarding.  Large ascites  Musculoskeletal: He exhibits edema.  4+ pitting edema to the bilateral lower extremities. Bilateral feet erythematous, left worse than left, warm to the touch.   Neurological: He is alert and oriented to person, place, and time.  Skin: Skin is warm and dry.    ED Course  Procedures (including critical care time) Labs Review Labs Reviewed  CBC WITH DIFFERENTIAL - Abnormal; Notable for the following:    RBC 4.00 (*)    Hemoglobin 11.2 (*)    HCT 34.7 (*)    RDW 22.4 (*)    Platelets 76 (*)    Lymphs Abs 0.6 (*)    Monocytes Relative 13 (*)    Basophils Relative 2 (*)    All other components within normal limits  BASIC METABOLIC PANEL - Abnormal; Notable for the following:    GFR calc non Af Amer 89 (*)    All other components within normal limits  HEPATIC FUNCTION PANEL - Abnormal; Notable for the following:    Albumin 2.8 (*)    All other components within normal limits  AMMONIA - Abnormal; Notable for the following:    Ammonia 71 (*)    All other components within normal limits  PROTIME-INR  APTT  CG4 I-STAT (LACTIC ACID)  POCT I-STAT TROPONIN I   Imaging Review No results found.  MDM   1. Cellulitis of left foot   2. Ascites   3. Increased ammonia level     Patient large ascites in his abdomen. Also lower extremity cellulitis bilaterally left worse than right. He is afebrile, is nontoxic appearing, doubt sepsis. Lactic acid normal white count is normal. I have started him on vancomycin and  Cipro for his infection. Patient is allergic to penicillins. Patient's ammonia is slightly elevated at 71, some of the notes from his chart reports that he has history of the same however we do not have prior lab values to compare this to. Ordered him lactulose 20 mg by mouth. [facility] because of suspect patient will need an admission, suspect patient will need drainage of his ascites as well as further treatment of his leg cellulitis.    Filed Vitals:   11/09/12 1715 11/09/12 1745 11/09/12 1815 11/09/12 1845  BP: 105/55 124/66 113/64 107/44  Pulse: 65 65 64 64  Temp:      TempSrc:      Resp: 21 23 23 23   SpO2: 99% 98% 98% 97%     Kamiya Acord A Michaeljohn Biss, PA-C 11/09/12 2018

## 2012-11-09 NOTE — ED Notes (Signed)
Patient said he had his fluid drained 13 pints at the [place].  The patient said he began to gain weight from the 10th of September until today, he has gained 51lbs.  The patient said he feels like something is sitting on his chest and he is having difficulty breathing.  Patient is currently satting 98% on room air.

## 2012-11-10 ENCOUNTER — Inpatient Hospital Stay (HOSPITAL_COMMUNITY): Payer: Non-veteran care

## 2012-11-10 DIAGNOSIS — R609 Edema, unspecified: Secondary | ICD-10-CM

## 2012-11-10 DIAGNOSIS — D638 Anemia in other chronic diseases classified elsewhere: Secondary | ICD-10-CM

## 2012-11-10 DIAGNOSIS — K769 Liver disease, unspecified: Secondary | ICD-10-CM

## 2012-11-10 LAB — BODY FLUID CELL COUNT WITH DIFFERENTIAL
Lymphs, Fluid: 61 %
Neutrophil Count, Fluid: 8 % (ref 0–25)
Total Nucleated Cell Count, Fluid: 115 cu mm (ref 0–1000)

## 2012-11-10 LAB — BASIC METABOLIC PANEL
BUN: 11 mg/dL (ref 6–23)
Creatinine, Ser: 0.8 mg/dL (ref 0.50–1.35)
GFR calc Af Amer: >90 mL/min (ref >90)
GFR calc non Af Amer: >90 mL/min (ref >90)
Potassium: 4 mEq/L (ref 3.5–5.1)

## 2012-11-10 LAB — CBC
HCT: 32.4 % — ABNORMAL LOW (ref 39.0–52.0)
Hemoglobin: 10.2 g/dL — ABNORMAL LOW (ref 13.0–17.0)
MCHC: 31.5 g/dL (ref 30.0–36.0)
RBC: 3.7 MIL/uL — ABNORMAL LOW (ref 4.22–5.81)
RDW: 22.6 % — ABNORMAL HIGH (ref 11.5–15.5)

## 2012-11-10 LAB — ALBUMIN, FLUID (OTHER): Albumin, Fluid: 0.6 g/dL

## 2012-11-10 MED ORDER — CLINDAMYCIN HCL 300 MG PO CAPS
450.0000 mg | ORAL_CAPSULE | Freq: Three times a day (TID) | ORAL | Status: DC
  Administered 2012-11-10 – 2012-11-14 (×11): 450 mg via ORAL
  Filled 2012-11-10 (×18): qty 1

## 2012-11-10 MED ORDER — CIPROFLOXACIN IN D5W 400 MG/200ML IV SOLN
400.0000 mg | Freq: Two times a day (BID) | INTRAVENOUS | Status: DC
  Filled 2012-11-10 (×2): qty 200

## 2012-11-10 MED ORDER — VANCOMYCIN HCL IN DEXTROSE 1-5 GM/200ML-% IV SOLN
1000.0000 mg | Freq: Three times a day (TID) | INTRAVENOUS | Status: DC
  Administered 2012-11-10: 1000 mg via INTRAVENOUS
  Filled 2012-11-10 (×4): qty 200

## 2012-11-10 NOTE — Clinical Social Work Note (Signed)
CSW attempted to speak with pt regarding possible return to SNF once medically discharged. Pt was currently not available. CSW will attempt to speak with pt at bedside later on in the day.  Darlyn Chamber, MSW, LCSWA Clinical Social Work [phone]

## 2012-11-10 NOTE — Progress Notes (Signed)
No IV access. MD notified.

## 2012-11-10 NOTE — Procedures (Signed)
Successful US guided paracentesis from RLQ.  Yielded 7.3 liters of clear yellow fluid.  No immediate complications.  Pt tolerated well.   Specimen was sent for labs.  Brayton El PA-C 11/10/2012 10:36 AM

## 2012-11-10 NOTE — Progress Notes (Signed)
[facility] PROGRESS NOTE  Shawn COCCIA HYQ:[id number] DOB: 10-Apr-1950 DOA: 11/09/2012 PCP: No primary provider on file.  Assessment/Plan: 1. Recurrent ascites, in setting of end-stage liver disease.  Patient with history of alcoholic liver cirrhosis. Ultrasound guided diagnostic and therapeutic paracentesis performed today with the removal of 7 L of ascitic fluid. Patient tolerated procedure well there were no immediate complications. Continue diuretic therapy with spironolactone and Lasix. Postprocedure patient reports feeling much better. 2. Cellulitis. Patient presenting with erythema involving left foot, and to a lesser extent his right foot. He reports noting some improvement today. Will transition him to oral clindamycin 450 mg by mouth 3 times a day. 3. Hyperammonemia. Patient having an ammonia level of 71, secondary to end-stage liver disease. Continue lactulose.  4. Thrombocytopenia. Lab work showed a platelet count of 57, likely secondary to end-stage liver disease. 5. Anemia of chronic disease. Stable  Code Status: Full code Family Communication: Plan discussed with patient Disposition Plan: Patient undergoing thoracentesis today, will continue empiric antimicrobial therapy reassess tomorrow morning   Procedures:  Ultrasound-guided paracentesis, date of procedure 11/10/2012. Approximately 7.3 L of clear yellow fluid removed.  Antibiotics:  Clindamycin 450 mg by mouth every 8 hours  HPI/Subjective: Patient is being seen after ultrasound guided paracentesis, reports feeling better without it is not as distended. Also reports improvement to bilateral extremity pitting edema.  Objective: Filed Vitals:   11/10/12 1038  BP: 101/41  Pulse:   Temp:   Resp:     Intake/Output Summary (Last 24 hours) at 11/10/12 1638 Last data filed at 11/10/12 9528  Gross per 24 hour  Intake    940 ml  Output      0 ml  Net    940 ml   Filed Weights   11/09/12 2122 11/10/12 0539   Weight: 147.328 kg (324 lb 12.8 oz) 146.603 kg (323 lb 3.2 oz)    Exam:   General:  Patient is in no acute distress, he is sitting down reading his newspaper  Cardiovascular: Regular rate and rhythm normal S1-S2  Respiratory: Lungs overall clear to auscultation, normal respiratory effort, he does not require supplemental oxygen  Abdomen: Distended, however nontender no palpable masses or rebound tenderness  Musculoskeletal: Preserved range of motion of all extremities  Skin: Erythema noted at dorsum of left foot, to lesser degree dorsum of right foot.   Data Reviewed: Basic Metabolic Panel:  Recent Labs Lab 11/09/12 1657 11/10/12 0544  NA 135 134*  K 4.0 4.0  CL 99 99  CO2 29 27  GLUCOSE 77 90  BUN 12 11  CREATININE 0.89 0.80  CALCIUM 8.5 8.4   Liver Function Tests:  Recent Labs Lab 11/09/12 1657  AST 27  ALT 16  ALKPHOS 98  BILITOT 0.7  PROT 6.5  ALBUMIN 2.8*   No results found for this basename: LIPASE, AMYLASE,  in the last 168 hours  Recent Labs Lab 11/09/12 1657  AMMONIA 71*   CBC:  Recent Labs Lab 11/09/12 1657 11/10/12 0544  WBC 4.7 2.9*  NEUTROABS 3.1  --   HGB 11.2* 10.2*  HCT 34.7* 32.4*  MCV 86.8 87.6  PLT 76* 57*   Cardiac Enzymes: No results found for this basename: CKTOTAL, CKMB, CKMBINDEX, TROPONINI,  in the last 168 hours BNP (last 3 results) No results found for this basename: PROBNP,  in the last 8760 hours CBG: No results found for this basename: GLUCAP,  in the last 168 hours  No results found for this  or any previous visit (from the past 240 hour(s)).   Studies: US Paracentesis  11/10/2012   *RADIOLOGY REPORT*  Clinical Data: Alcoholic liver disease, cirrhosis, ascites. Request for diagnostic and therapeutic paracentesis.  ULTRASOUND GUIDED PARACENTESIS  Comparison:  None  An ultrasound guided paracentesis was thoroughly discussed with the patient and questions answered.  The benefits, risks, alternatives and  complications were also discussed.  The patient understands and wishes to proceed with the procedure.  Written consent was obtained.  Ultrasound was performed to localize and mark an adequate pocket of fluid in the right lower quadrant of the abdomen.  The area was then prepped and draped in the normal sterile fashion.  1% Lidocaine was used for local anesthesia.  Under ultrasound guidance a 19 gauge Yueh catheter was introduced.  Paracentesis was performed.  The catheter was removed and a dressing applied.  Complications:  None  Findings:  A total of approximately 7.3 liters of clear yellow fluid was removed.  A fluid sample was sent for laboratory analysis.  IMPRESSION: Successful ultrasound guided paracentesis yielding 7.3 liters of ascites.  Read by: Brayton El, P.A,-C   Original Report Authenticated By: Tacey Ruiz, MD   Dg Chest Port 1 View  11/10/2012   *RADIOLOGY REPORT*  Clinical Data: Shortness of breath.  PORTABLE CHEST - 1 VIEW  Comparison: None available at time of study interpretation.  Findings: Cardiomediastinal silhouette is unremarkable for this low inspiratory portable examination.  Mild calcific atherosclerosis of the aortic knob.  Curvilinear densities in left lung base, the left costophrenic angle is incompletely imaged.  No definite pleural effusions.  Pulmonary vasculature is not suspicious.  No pneumothorax.  Multiple EKG lines overlay the patient and could obscure underlying subtle pathology.  Included soft tissue planes and osseous structures are not suspicious.  IMPRESSION: Curvilinear densities in left lung base favoring atelectasis.   Original Report Authenticated By: Awilda Metro    Scheduled Meds: . amLODipine  5 mg Oral Daily  . clindamycin  450 mg Oral Q8H  . ferrous gluconate  325 mg Oral BID WC  . furosemide  40 mg Oral Daily  . influenza vac split quadrivalent PF  0.5 mL Intramuscular Tomorrow-1000  . lactulose  20 g Oral TID  . levothyroxine  25 mcg Oral  QAC breakfast  . pantoprazole  40 mg Oral Daily  . propranolol  80 mg Oral BID  . sertraline  200 mg Oral Daily  . sodium chloride  3 mL Intravenous Q12H  . sodium chloride  3 mL Intravenous Q12H  . spironolactone  100 mg Oral Daily   Continuous Infusions:   Principal Problem:   Ascites Active Problems:   Cirrhosis of liver   Hypothyroidism   Anemia of chronic disease   Cellulitis of left foot    Time spent: 35 minutes    Shawn Wallace  [facility] Pager [phone]. If 7PM-7AM, please contact night-coverage at [email or web address], password [facility] 11/10/2012, 4:38 PM  LOS: 1 day

## 2012-11-10 NOTE — Progress Notes (Signed)
*  Preliminary Results* Bilateral lower extremity venous duplex completed. Visualized veins of bilateral lower extremities are negative for deep vein thrombosis. There is no evidence of Baker's cyst bilaterally.  11/10/2012  Gertie Fey, RVT, RDCS, RDMS

## 2012-11-11 DIAGNOSIS — E7220 Disorder of urea cycle metabolism, unspecified: Secondary | ICD-10-CM

## 2012-11-11 DIAGNOSIS — I4891 Unspecified atrial fibrillation: Secondary | ICD-10-CM

## 2012-11-11 LAB — CBC
MCV: 88.3 fL (ref 78.0–100.0)
Platelets: 62 10*3/uL — ABNORMAL LOW (ref 150–400)
RBC: 3.92 MIL/uL — ABNORMAL LOW (ref 4.22–5.81)
WBC: 2.4 10*3/uL — ABNORMAL LOW (ref 4.0–10.5)

## 2012-11-11 LAB — BASIC METABOLIC PANEL
CO2: 31 mEq/L (ref 19–32)
Calcium: 8.7 mg/dL (ref 8.4–10.5)
Chloride: 102 mEq/L (ref 96–112)
Creatinine, Ser: 0.84 mg/dL (ref 0.50–1.35)
GFR calc Af Amer: >90 mL/min (ref >90)
Glucose, Bld: 83 mg/dL (ref 70–99)
Sodium: 138 mEq/L (ref 135–145)

## 2012-11-11 NOTE — Progress Notes (Signed)
[facility] PROGRESS NOTE  Shawn Wallace GEX:[id number] DOB: August 08, 1950 DOA: 11/09/2012 PCP: No primary provider on file.  Assessment/Plan: 1. Recurrent ascites, in setting of end-stage liver disease.  Patient with history of alcoholic liver cirrhosis. Ultrasound guided diagnostic and therapeutic paracentesis performed today with the removal of 7 L of ascitic fluid. Patient tolerated procedure well there were no immediate complications. Continue diuretic therapy with spironolactone and Lasix. Postprocedure patient reports feeling much better. 2. Major fibrillation. Patient going into A. fib overnight, spontaneously converting back to normal sinus rhythm. It's possible he may have paroxysmal 8 for ablation. I think he is a poor candidate for anticoagulation given history of end-stage liver disease. He is presently sinus rhythm, will monitor. 3. Cellulitis. Erythema appears improved. Patient states his left foot is not as tender as it was yesterday. Continue clindamycin by mouth 450 mg q. 8 hours. 4. Hyperammonemia. Patient having an ammonia level of 71, secondary to end-stage liver disease. Continue lactulose.  5. Thrombocytopenia. Stable, likely secondary to end-stage liver disease 6. Anemia of chronic disease. Stable  Code Status: Full code Family Communication: Plan discussed with patient Disposition Plan: Patient undergoing thoracentesis today, will continue empiric antimicrobial therapy reassess tomorrow morning   Procedures:  Ultrasound-guided paracentesis, date of procedure 11/10/2012. Approximately 7.3 L of clear yellow fluid removed.  Antibiotics:  Clindamycin 450 mg by mouth every 8 hours  HPI/Subjective: Patient reports feeling better today, has no complaints. Reports improvement to erythema over her foot.  Objective: Filed Vitals:   11/11/12 1057  BP: 102/57  Pulse: 67  Temp: 97.8 F (36.6 C)  Resp: 18    Intake/Output Summary (Last 24 hours) at 11/11/12  1227 Last data filed at 11/11/12 0900  Gross per 24 hour  Intake    750 ml  Output      0 ml  Net    750 ml   Filed Weights   11/09/12 2122 11/10/12 0539 11/11/12 0622  Weight: 147.328 kg (324 lb 12.8 oz) 146.603 kg (323 lb 3.2 oz) 128.64 kg (283 lb 9.6 oz)    Exam:   General:  Patient is in no acute distress, he is sitting down reading his newspaper  Cardiovascular: Regular rate and rhythm normal S1-S2  Respiratory: Lungs overall clear to auscultation, normal respiratory effort, he does not require supplemental oxygen  Abdomen: Distended, however nontender no palpable masses or rebound tenderness  Musculoskeletal: Preserved range of motion of all extremities  Skin: Erythema noted at dorsum of left foot, to lesser degree dorsum of right foot.   Data Reviewed: Basic Metabolic Panel:  Recent Labs Lab 11/09/12 1657 11/10/12 0544 11/11/12 0630  NA 135 134* 138  K 4.0 4.0 4.0  CL 99 99 102  CO2 29 27 31   GLUCOSE 77 90 83  BUN 12 11 10   CREATININE 0.89 0.80 0.84  CALCIUM 8.5 8.4 8.7   Liver Function Tests:  Recent Labs Lab 11/09/12 1657  AST 27  ALT 16  ALKPHOS 98  BILITOT 0.7  PROT 6.5  ALBUMIN 2.8*   No results found for this basename: LIPASE, AMYLASE,  in the last 168 hours  Recent Labs Lab 11/09/12 1657  AMMONIA 71*   CBC:  Recent Labs Lab 11/09/12 1657 11/10/12 0544 11/11/12 0630  WBC 4.7 2.9* 2.4*  NEUTROABS 3.1  --   --   HGB 11.2* 10.2* 10.9*  HCT 34.7* 32.4* 34.6*  MCV 86.8 87.6 88.3  PLT 76* 57* 62*   Cardiac Enzymes: No results  found for this basename: CKTOTAL, CKMB, CKMBINDEX, TROPONINI,  in the last 168 hours BNP (last 3 results) No results found for this basename: PROBNP,  in the last 8760 hours CBG: No results found for this basename: GLUCAP,  in the last 168 hours  Recent Results (from the past 240 hour(s))  BODY FLUID CULTURE     Status: None   Collection Time    11/10/12 10:01 AM      Result Value Range Status    Specimen Description ASCITIC ABDOMEN FLUID   Final   Special Requests 60 ML FLUID   Final   Gram Stain     Final   Value: RARE WBC PRESENT, PREDOMINANTLY MONONUCLEAR     NO ORGANISMS SEEN     Performed at [facility]   Culture     Final   Value: NO GROWTH 1 DAY     Performed at [facility]   Report Status PENDING   Incomplete     Studies: US Paracentesis  11/10/2012   *RADIOLOGY REPORT*  Clinical Data: Alcoholic liver disease, cirrhosis, ascites. Request for diagnostic and therapeutic paracentesis.  ULTRASOUND GUIDED PARACENTESIS  Comparison:  None  An ultrasound guided paracentesis was thoroughly discussed with the patient and questions answered.  The benefits, risks, alternatives and complications were also discussed.  The patient understands and wishes to proceed with the procedure.  Written consent was obtained.  Ultrasound was performed to localize and mark an adequate pocket of fluid in the right lower quadrant of the abdomen.  The area was then prepped and draped in the normal sterile fashion.  1% Lidocaine was used for local anesthesia.  Under ultrasound guidance a 19 gauge Yueh catheter was introduced.  Paracentesis was performed.  The catheter was removed and a dressing applied.  Complications:  None  Findings:  A total of approximately 7.3 liters of clear yellow fluid was removed.  A fluid sample was sent for laboratory analysis.  IMPRESSION: Successful ultrasound guided paracentesis yielding 7.3 liters of ascites.  Read by: Brayton El, P.A,-C   Original Report Authenticated By: Tacey Ruiz, MD   Dg Chest Port 1 View  11/10/2012   *RADIOLOGY REPORT*  Clinical Data: Shortness of breath.  PORTABLE CHEST - 1 VIEW  Comparison: None available at time of study interpretation.  Findings: Cardiomediastinal silhouette is unremarkable for this low inspiratory portable examination.  Mild calcific atherosclerosis of the aortic knob.  Curvilinear densities in left lung base, the  left costophrenic angle is incompletely imaged.  No definite pleural effusions.  Pulmonary vasculature is not suspicious.  No pneumothorax.  Multiple EKG lines overlay the patient and could obscure underlying subtle pathology.  Included soft tissue planes and osseous structures are not suspicious.  IMPRESSION: Curvilinear densities in left lung base favoring atelectasis.   Original Report Authenticated By: Awilda Metro    Scheduled Meds: . amLODipine  5 mg Oral Daily  . clindamycin  450 mg Oral Q8H  . ferrous gluconate  325 mg Oral BID WC  . furosemide  40 mg Oral Daily  . lactulose  20 g Oral TID  . levothyroxine  25 mcg Oral QAC breakfast  . pantoprazole  40 mg Oral Daily  . propranolol  80 mg Oral BID  . sertraline  200 mg Oral Daily  . sodium chloride  3 mL Intravenous Q12H  . sodium chloride  3 mL Intravenous Q12H  . spironolactone  100 mg Oral Daily   Continuous Infusions:  Principal Problem:   Ascites Active Problems:   Cirrhosis of liver   Hypothyroidism   Anemia of chronic disease   Cellulitis of left foot    Time spent: 35 minutes    Jeralyn Bennett  [facility] Pager [phone]. If 7PM-7AM, please contact night-coverage at [email or web address], password [facility] 11/11/2012, 12:27 PM  LOS: 2 days

## 2012-11-11 NOTE — Progress Notes (Signed)
Patient on telemetry running NSR until now. Has recently converted to what appears to be a. Fib. Patient sleeping upon entering room. Asymptomatic. State he has no known history of a.fib or any other cardiac problems. Denies chest pain, SOB, palpitations and dizziness. Practitioner on call notified. 12 lead EKG was done per order showing a fib. Practitioner will come see patient.

## 2012-11-11 NOTE — Clinical Social Work Placement (Signed)
Clinical Social Work Department CLINICAL SOCIAL WORK PLACEMENT NOTE 11/11/2012  Patient:  DEMERE, DOTZLER  Account Number:  [id number] Admit date:  11/09/2012  Clinical Social Worker:  Irving Burton [place], LCSWA  Date/time:  11/11/2012 03:34 PM  Clinical Social Work is seeking post-discharge placement for this patient at the following level of care:   SKILLED NURSING   (*CSW will update this form in Epic as items are completed)   11/11/2012  Patient/family provided with [facility] Department of Clinical Social Works list of facilities offering this level of care within the geographic area requested by the patient (or if unable, by the patients family).  11/11/2012  Patient/family informed of their freedom to choose among providers that offer the needed level of care, that participate in Medicare, Medicaid or managed care program needed by the patient, have an available bed and are willing to accept the patient.  11/11/2012  Patient/family informed of [facility] ownership interest in [facility], as well as of the fact that they are under no obligation to receive care at this facility.  PASARR submitted to EDS on 11/11/2012 PASARR number received from EDS on 11/11/2012  FL2 transmitted to all facilities in geographic area requested by pt/family on  11/11/2012 FL2 transmitted to all facilities within larger geographic area on   Patient informed that his/her managed care company has contracts with or will negotiate with  certain facilities, including the following:     Patient/family informed of bed offers received:  11/11/2012 Patient chooses bed at [facility] Physician recommends and patient chooses bed at  [facility]  Patient to be transferred to [facility] on   Patient to be transferred to facility by   The following physician request were entered in Epic:   Additional Comments:   Darlyn Chamber, MSW,  LCSWA Clinical Social Work [phone]

## 2012-11-11 NOTE — Clinical Social Work Psychosocial (Signed)
Clinical Social Work Department BRIEF PSYCHOSOCIAL ASSESSMENT 11/11/2012  Patient:  Shawn Wallace, Shawn Wallace     Account Number:  [id number]     Admit date:  11/09/2012  Clinical Social Worker:  Sherre Lain  Date/Time:  11/11/2012 03:31 PM  Referred by:  Physician  Date Referred:  11/11/2012 Referred for  SNF Placement   Other Referral:   none.   Interview type:  Patient Other interview type:   none.    PSYCHOSOCIAL DATA Living Status:  FACILITY Admitted from facility:  [facility] Level of care:  Skilled Nursing Facility Primary support name:  none given. Primary support relationship to patient:   Degree of support available:    CURRENT CONCERNS Current Concerns  Post-Acute Placement   Other Concerns:   none.    SOCIAL WORK ASSESSMENT / PLAN CSW met with pt at bedside. Pt stated that he was feeling much better today (11/11/2012) compared to earlier in the week. Pt noted that prior to admission to [facility] pt was residing at [facility]. Pt stated that he would like to return to [facility] once he is medically discharged from [facility]. CSW contacted [facility] whom stated that pt was able to return once medically stable for discharge. CSW to continue to follow and assist with discharge planning needs.   Assessment/plan status:  Psychosocial Support/Ongoing Assessment of Needs Other assessment/ plan:   none.   Information/referral to community resources:   [facility] bed offers.    PATIENTS/FAMILYS RESPONSE TO PLAN OF CARE: Pt was understanding and agreeable to CSW plan of care.     Darlyn Chamber, MSW, LCSWA Clinical Social Work [phone]

## 2012-11-11 NOTE — Progress Notes (Signed)
Comment:   Notified by RN that pt had converted from NSR to A-Fib w/ RVR. 12-Lead EKG not c/w A-Fib as p-waves noted in many complexes. Rate is somewhat increased at 108 and appears irregular. Pt sleeping when rate change noted and denied symptoms or h/o A-Fib. At bedside pt was noted resting in NAD. BBS CTA. VSS. Discussed pt w/ Dr Toniann Fail who agreed EKG was not c/w A-Fib. He discussed EKG w/ on call cardiologist w/ [facility] who reviewed EKG and felt interpretation more c/w SR w/ PAC's. Will continue to monitor closely.  Leanne Chang, NP-C [facility] Pager [phone]

## 2012-11-12 MED ORDER — FUROSEMIDE 10 MG/ML IJ SOLN: 40.0000 mg | Freq: Once | INTRAMUSCULAR | Status: DC

## 2012-11-12 MED ORDER — FUROSEMIDE 40 MG PO TABS
60.0000 mg | ORAL_TABLET | Freq: Once | ORAL | Status: AC
  Administered 2012-11-12: 60 mg via ORAL

## 2012-11-12 MED ORDER — SPIRONOLACTONE 25 MG PO TABS
125.0000 mg | ORAL_TABLET | Freq: Every day | ORAL | Status: DC
  Administered 2012-11-12 – 2012-11-14 (×3): 125 mg via ORAL
  Filled 2012-11-12 (×3): qty 1

## 2012-11-12 MED ORDER — SPIRONOLACTONE 25 MG PO TABS: 125.0000 mg | ORAL_TABLET | Freq: Every day | ORAL | Status: AC

## 2012-11-12 MED ORDER — FUROSEMIDE 20 MG PO TABS: 60.0000 mg | ORAL_TABLET | Freq: Every day | ORAL | Status: AC

## 2012-11-12 MED ORDER — FUROSEMIDE 20 MG PO TABS
60.0000 mg | ORAL_TABLET | Freq: Every day | ORAL | Status: DC
Start: 2012-11-12 — End: 2012-11-14
  Administered 2012-11-12 – 2012-11-14 (×3): 60 mg via ORAL
  Filled 2012-11-12 (×5): qty 1

## 2012-11-12 MED ORDER — CLINDAMYCIN HCL 150 MG PO CAPS: 450.0000 mg | ORAL_CAPSULE | Freq: Three times a day (TID) | ORAL | Status: DC

## 2012-11-12 NOTE — Progress Notes (Signed)
[facility] PROGRESS NOTE  Shawn LOMAS HYQ:[id number] DOB: August 25, 1950 DOA: 11/09/2012 PCP: No primary provider on file.  Assessment/Plan: 1. Recurrent ascites, in setting of end-stage liver disease.  Patient with history of alcoholic liver cirrhosis. Ultrasound guided diagnostic and therapeutic paracentesis performed with removal of 7 L of ascitic fluid. 2. Bilateral extremity edema. Likely secondary to end-stage liver disease. Will give extra dose of Lasix today. Home regimen of diuretic therapy may need to be titrated.  3. Cellulitis. Patient presenting with erythema involving left foot, and to a lesser extent his right foot. He reports noting some improvement today. Continue clindamycin 450 mg by mouth 3 times a day 4. Hyperammonemia. Patient having an ammonia level of 71, secondary to end-stage liver disease. Continue lactulose.  5. Thrombocytopenia. Lab work showed a platelet count of 57, likely secondary to end-stage liver disease. 6. Anemia of chronic disease. Stable  Code Status: Full code Family Communication: Plan discussed with patient Disposition Plan: Plan for discharge to skilled nursing facility tomorrow if he continues to show improvement   Procedures:  Ultrasound-guided paracentesis, date of procedure 11/10/2012. Approximately 7.3 L of clear yellow fluid removed.  Antibiotics:  Clindamycin 450 mg by mouth every 8 hours  HPI/Subjective: Patient today reports increasing bilateral extremity pitting edema. He also complains of associated pain, however remains afebrile, nontoxic, tolerating by mouth intake.   Objective: Filed Vitals:   11/12/12 1334  BP: 104/49  Pulse: 61  Temp: 98.2 F (36.8 C)  Resp: 18    Intake/Output Summary (Last 24 hours) at 11/12/12 1621 Last data filed at 11/12/12 0600  Gross per 24 hour  Intake    300 ml  Output      0 ml  Net    300 ml   Filed Weights   11/10/12 0539 11/11/12 0622 11/12/12 0610  Weight: 146.603 kg (323  lb 3.2 oz) 128.64 kg (283 lb 9.6 oz) 132.7 kg (292 lb 8.8 oz)    Exam:   General:  Patient is in no acute distress, he is sitting down reading his newspaper  Cardiovascular: Regular rate and rhythm normal S1-S2  Respiratory: Lungs overall clear to auscultation, normal respiratory effort, he does not require supplemental oxygen  Abdomen: Distended, however nontender no palpable masses or rebound tenderness  Musculoskeletal: Preserved range of motion of all extremities  Skin: Erythema noted at dorsum of left foot, to lesser degree dorsum of right foot.   Extremities: 3+ bilateral extremity pitting edema  Data Reviewed: Basic Metabolic Panel:  Recent Labs Lab 11/09/12 1657 11/10/12 0544 11/11/12 0630  NA 135 134* 138  K 4.0 4.0 4.0  CL 99 99 102  CO2 29 27 31   GLUCOSE 77 90 83  BUN 12 11 10   CREATININE 0.89 0.80 0.84  CALCIUM 8.5 8.4 8.7   Liver Function Tests:  Recent Labs Lab 11/09/12 1657  AST 27  ALT 16  ALKPHOS 98  BILITOT 0.7  PROT 6.5  ALBUMIN 2.8*   No results found for this basename: LIPASE, AMYLASE,  in the last 168 hours  Recent Labs Lab 11/09/12 1657  AMMONIA 71*   CBC:  Recent Labs Lab 11/09/12 1657 11/10/12 0544 11/11/12 0630  WBC 4.7 2.9* 2.4*  NEUTROABS 3.1  --   --   HGB 11.2* 10.2* 10.9*  HCT 34.7* 32.4* 34.6*  MCV 86.8 87.6 88.3  PLT 76* 57* 62*   Cardiac Enzymes: No results found for this basename: CKTOTAL, CKMB, CKMBINDEX, TROPONINI,  in the last 168  hours BNP (last 3 results) No results found for this basename: PROBNP,  in the last 8760 hours CBG: No results found for this basename: GLUCAP,  in the last 168 hours  Recent Results (from the past 240 hour(s))  BODY FLUID CULTURE     Status: None   Collection Time    11/10/12 10:01 AM      Result Value Range Status   Specimen Description ASCITIC ABDOMEN FLUID   Final   Special Requests 60 ML FLUID   Final   Gram Stain     Final   Value: RARE WBC PRESENT, PREDOMINANTLY  MONONUCLEAR     NO ORGANISMS SEEN     Performed at [facility]   Culture     Final   Value: NO GROWTH 2 DAYS     Performed at [facility]   Report Status PENDING   Incomplete     Studies: No results found.  Scheduled Meds: . clindamycin  450 mg Oral Q8H  . ferrous gluconate  325 mg Oral BID WC  . furosemide  60 mg Oral Q breakfast  . lactulose  20 g Oral TID  . levothyroxine  25 mcg Oral QAC breakfast  . pantoprazole  40 mg Oral Daily  . propranolol  80 mg Oral BID  . sertraline  200 mg Oral Daily  . sodium chloride  3 mL Intravenous Q12H  . sodium chloride  3 mL Intravenous Q12H  . spironolactone  125 mg Oral Daily   Continuous Infusions:   Principal Problem:   Ascites Active Problems:   Cirrhosis of liver   Hypothyroidism   Anemia of chronic disease   Cellulitis of left foot    Time spent: 35 minutes    Jeralyn Bennett  [facility] Pager [phone]. If 7PM-7AM, please contact night-coverage at [email or web address], password [facility] 11/12/2012, 4:21 PM  LOS: 3 days

## 2012-11-12 NOTE — Discharge Summary (Addendum)
Physician Discharge Summary  Shawn Wallace ZOX:[id number] DOB: 08/08/1950 DOA: 11/09/2012  PCP: No primary provider on file.  Admit date: 11/09/2012 Discharge date: 11/14/12  Time spent: 35 minutes  Recommendations for Outpatient Follow-up:  Please have patient followed up in 1 week.   Discharge Diagnoses:  Principal Problem:   Ascites Active Problems:   Cirrhosis of liver   Hypothyroidism   Anemia of chronic disease   Cellulitis of left foot   Discharge Condition: Stable/improved  Diet recommendation: 2 g sodium diet, with 1000 mL fluid restriction  Filed Weights   11/10/12 0539 11/11/12 0622 11/12/12 0610  Weight: 146.603 kg (323 lb 3.2 oz) 128.64 kg (283 lb 9.6 oz) 132.7 kg (292 lb 8.8 oz)    History of present illness:  Shawn Wallace is a 62 y.o. male was transferred from nursing home after patient was found to have increasing ascites with lower extremity edema. Patient has known history of alcoholic liver cirrhosis and has had large volume paracentesis, almost 11 L last month September 10. Subsequent to which patient has been admitted to nursing home. Patient's ascites has slowly increased. Patient also has experienced some abdominal discomfort due to ascites. Denies any fever chills nausea vomiting diarrhea. Last 3 days patient has noticed increasing redness and lower extremity mostly in the left dorsal aspect of the foot. There is also mild redness in the right foot. Patient has been admitted for further management. Presently patient is not in acute distress.   Hospital Course:  Patient is a pleasant 62 year old gentleman with a past medical history of end-stage liver disease, undergoing multiple paracentesis, having almost 11 L of ascitic fluid removed in September. He was transferred from a skilled nursing facility to [facility] on 11/09/2012, as patient complained of worsening bilateral extremity edema as well as increasing abdominal distention. He was admitted  to the medicine service where he underwent ultrasound-guided paracentesis, procedure performed on 11/10/2012. Approximately 7.3 L of clear yellow fluid were removed. Gram stain of ascitic fluid was negative as cultures remained sterile. He reported significant improvement after paracentesis. Other issues addressed during this hospitalization include left foot cellulitis. He was started on clindamycin 450 mg by mouth q. 8 hours. There has been gradual improvement to erythema over dorsal aspect of left foot. On 11/12/2012, patient complaining of increasing bilateral extremity edema. He was administered an additional 60 mg of by mouth Lasix, with plans to monitor  Him the next 24 hours. I anticipate discharging him back to a skilled nursing facility this weekend.  On 11/23/12 patient doing better. He is ambulating around his room, tolerating PO intake, has been afebrile for greater than 48 hours. He reports improvement to his left foot.   Procedures:  Ultrasound-guided paracentesis, date of procedure 11/10/2012. Approximately 7.3 L of clear yellow fluid removed   Discharge Exam: Filed Vitals:   11/12/12 1334  BP: 104/49  Pulse: 61  Temp: 98.2 F (36.8 C)  Resp: 18    Exam:  General: Patient is in no acute distress, he is sitting down reading his newspaper  Cardiovascular: Regular rate and rhythm normal S1-S2  Respiratory: Lungs overall clear to auscultation, normal respiratory effort, he does not require supplemental oxygen  Abdomen: Distended, however nontender no palpable masses or rebound tenderness  Musculoskeletal: Preserved range of motion of all extremities  Skin: Erythema noted at dorsum of left foot, to lesser degree dorsum of right foot. There is overall improvement. Extremities: 3+ bilateral extremity pitting edema  Discharge Instructions      Discharge Orders   Future Orders Complete By Expires   Diet - low sodium heart healthy  As directed    Increase activity slowly   As directed        Medication List    STOP taking these medications       amLODipine 5 MG tablet  Commonly known as:  NORVASC      TAKE these medications       clindamycin 150 MG capsule  Commonly known as:  CLEOCIN  Take 3 capsules (450 mg total) by mouth every 8 (eight) hours.     ferrous gluconate 325 MG tablet  Commonly known as:  FERGON  Take 325 mg by mouth 2 (two) times daily.     furosemide 20 MG tablet  Commonly known as:  LASIX  Take 3 tablets (60 mg total) by mouth daily with breakfast.     lactulose (encephalopathy) 10 GM/15ML Soln  Commonly known as:  CHRONULAC  Take 10 g by mouth 2 (two) times daily.     levothyroxine 25 MCG tablet  Commonly known as:  SYNTHROID, LEVOTHROID  Take 25 mcg by mouth daily before breakfast.     omeprazole 20 MG capsule  Commonly known as:  PRILOSEC  Take 20 mg by mouth 2 (two) times daily.     oxyCODONE 5 MG immediate release tablet  Commonly known as:  Oxy IR/ROXICODONE  1 by mouth twice a day as needed for pain     propranolol 80 MG tablet  Commonly known as:  INDERAL  Take 80 mg by mouth 2 (two) times daily.     sertraline 100 MG tablet  Commonly known as:  ZOLOFT  Take 200 mg by mouth daily.     spironolactone 25 MG tablet  Commonly known as:  ALDACTONE  Take 5 tablets (125 mg total) by mouth daily.     zolpidem 5 MG tablet  Commonly known as:  AMBIEN  Take 1 tablet (5 mg total) by mouth at bedtime as needed for sleep.       Allergies  Allergen Reactions  . Penicillins Hives      The results of significant diagnostics from this hospitalization (including imaging, microbiology, ancillary and laboratory) are listed below for reference.    Significant Diagnostic Studies: US Paracentesis  11/10/2012   *RADIOLOGY REPORT*  Clinical Data: Alcoholic liver disease, cirrhosis, ascites. Request for diagnostic and therapeutic paracentesis.  ULTRASOUND GUIDED PARACENTESIS  Comparison:  None  An ultrasound guided  paracentesis was thoroughly discussed with the patient and questions answered.  The benefits, risks, alternatives and complications were also discussed.  The patient understands and wishes to proceed with the procedure.  Written consent was obtained.  Ultrasound was performed to localize and mark an adequate pocket of fluid in the right lower quadrant of the abdomen.  The area was then prepped and draped in the normal sterile fashion.  1% Lidocaine was used for local anesthesia.  Under ultrasound guidance a 19 gauge Yueh catheter was introduced.  Paracentesis was performed.  The catheter was removed and a dressing applied.  Complications:  None  Findings:  A total of approximately 7.3 liters of clear yellow fluid was removed.  A fluid sample was sent for laboratory analysis.  IMPRESSION: Successful ultrasound guided paracentesis yielding 7.3 liters of ascites.  Read by: Brayton El, P.A,-C   Original Report Authenticated By: Tacey Ruiz, MD   [facility]  11/10/2012   *RADIOLOGY REPORT*  Clinical Data: Shortness of breath.  PORTABLE CHEST - 1 VIEW  Comparison: None available at time of study interpretation.  Findings: Cardiomediastinal silhouette is unremarkable for this low inspiratory portable examination.  Mild calcific atherosclerosis of the aortic knob.  Curvilinear densities in left lung base, the left costophrenic angle is incompletely imaged.  No definite pleural effusions.  Pulmonary vasculature is not suspicious.  No pneumothorax.  Multiple EKG lines overlay the patient and could obscure underlying subtle pathology.  Included soft tissue planes and osseous structures are not suspicious.  IMPRESSION: Curvilinear densities in left lung base favoring atelectasis.   Original Report Authenticated By: Awilda Metro    Microbiology: Recent Results (from the past 240 hour(s))  BODY FLUID CULTURE     Status: None   Collection Time    11/10/12 10:01 AM      Result Value Range Status    Specimen Description ASCITIC ABDOMEN FLUID   Final   Special Requests 60 ML FLUID   Final   Gram Stain     Final   Value: RARE WBC PRESENT, PREDOMINANTLY MONONUCLEAR     NO ORGANISMS SEEN     Performed at [facility]   Culture     Final   Value: NO GROWTH 2 DAYS     Performed at [facility]   Report Status PENDING   Incomplete     Labs: Basic Metabolic Panel:  Recent Labs Lab 11/09/12 1657 11/10/12 0544 11/11/12 0630  NA 135 134* 138  K 4.0 4.0 4.0  CL 99 99 102  CO2 29 27 31   GLUCOSE 77 90 83  BUN 12 11 10   CREATININE 0.89 0.80 0.84  CALCIUM 8.5 8.4 8.7   Liver Function Tests:  Recent Labs Lab 11/09/12 1657  AST 27  ALT 16  ALKPHOS 98  BILITOT 0.7  PROT 6.5  ALBUMIN 2.8*   No results found for this basename: LIPASE, AMYLASE,  in the last 168 hours  Recent Labs Lab 11/09/12 1657  AMMONIA 71*   CBC:  Recent Labs Lab 11/09/12 1657 11/10/12 0544 11/11/12 0630  WBC 4.7 2.9* 2.4*  NEUTROABS 3.1  --   --   HGB 11.2* 10.2* 10.9*  HCT 34.7* 32.4* 34.6*  MCV 86.8 87.6 88.3  PLT 76* 57* 62*   Cardiac Enzymes: No results found for this basename: CKTOTAL, CKMB, CKMBINDEX, TROPONINI,  in the last 168 hours BNP: BNP (last 3 results) No results found for this basename: PROBNP,  in the last 8760 hours CBG: No results found for this basename: GLUCAP,  in the last 168 hours     Signed:  Jeralyn Bennett  [facility] 11/12/2012, 3:11 PM

## 2012-11-13 DIAGNOSIS — L0291 Cutaneous abscess, unspecified: Secondary | ICD-10-CM

## 2012-11-13 LAB — BODY FLUID CULTURE: Culture: NO GROWTH

## 2012-11-13 NOTE — Progress Notes (Signed)
PROGRESS NOTE  DATE: 11/08/2012  FACILITY: Nursing Home Location: [facility]  LEVEL OF CARE: SNF (31)  Routine Visit  CHIEF COMPLAINT:  Manage hypothyroidism, anemia of chronic disease and end-stage liver disease  HISTORY OF PRESENT ILLNESS:  REASSESSMENT OF ONGOING PROBLEM(S):  ANEMIA: The anemia has been stable. The patient denies fatigue, melena or hematochezia. No complications from the medications currently being used. In 9-14 hemoglobin 11.4, MCV 87.  HYPOTHYROIDISM: The hypothyroidism remains stable. No complications noted from the medications presently being used.  The patient denies fatigue or constipation.  Last TSH not available.  CIRRHOSIS: The cirrhosis remains stable.  Staff deny increasing confusion.  No complications reported from the medication(s) currently being used.  PAST MEDICAL HISTORY : Reviewed.  No changes.  CURRENT MEDICATIONS: Reviewed per [facility]  REVIEW OF SYSTEMS:  GENERAL: no change in appetite, no fatigue, no weight changes, no fever, chills or weakness RESPIRATORY: no cough, SOB, DOE, wheezing, hemoptysis CARDIAC: no chest pain, or palpitations, complains of chronic lower extremity swelling GI: no abdominal pain, diarrhea, constipation, heart burn, nausea or vomiting  PHYSICAL EXAMINATION  VS:  T 96.9      P 66      RR 18      BP 124/74     POX %     WT (Lb) 270  GENERAL: no acute distress, morbidly obese body habitus EYES: conjunctivae normal, sclerae normal, normal eye lids NECK: supple, trachea midline, no neck masses, no thyroid tenderness, no thyromegaly LYMPHATICS: no LAN in the neck, no supraclavicular LAN RESPIRATORY: breathing is even & unlabored, BS CTAB CARDIAC: RRR, no murmur,no extra heart sounds, +4 bilateral lower extremity edema GI: abdomen soft, normal BS, no masses, no tenderness, no hepatomegaly, no splenomegaly PSYCHIATRIC: the patient is alert & oriented to person, affect & behavior  appropriate  LABS/RADIOLOGY:  9-14 hemoglobin 11.4, MCV 87, platelets 92, WBC 4.7, BMP normal  ASSESSMENT/PLAN:  Hypothyroidism-check TSH Anemia of chronic disease-continue iron. End stage liver disease-well compensated. Chronic pain-oxycodone was started. Gout-stable Hypertension-well-controlled GERD-stable Lower extremity edema stable Check liver profile  CPT CODE: 46962

## 2012-11-13 NOTE — Progress Notes (Signed)
[facility] PROGRESS NOTE  Shawn Wallace WUJ:[id number] DOB: [birth year] DOA: 11/09/2012 PCP: No primary provider on file.  Assessment/Plan: 1. Recurrent ascites, in setting of end-stage liver disease.  Status post ultrasound-guided paracentesis with removal of 7.3 L of fluid. 2. Bilateral extremity edema. Likely secondary to end-stage liver disease. Yesterday evening I gave him an additional 60 mg by mouth Lasix, with titration of his home dose of Lasix from 40-60 mg by mouth q. daily. Also increased his home dose of spironolactone from 100-125 mg by mouth daily. 3. Cellulitis. Patient presenting with erythema involving left foot, and to a lesser extent his right foot. He reports noting some improvement today. Continue clindamycin 450 mg by mouth 3 times a day 4. Hyperammonemia. Patient having an ammonia level of 71, secondary to end-stage liver disease. Continue lactulose.  5. Thrombocytopenia. Stable, likely secondary to end-stage liver disease 6. Anemia of chronic disease. Stable  Code Status: Full code Family Communication: Plan discussed with patient Disposition Plan: Plan for discharge to skilled nursing facility tomorrow if he continues to show improvement.  An additional 60 mg of by mouth Lasix was administered yesterday evening, as he is and fluid restriction. Complains of significant bilateral edema, will monitor diuresis, anticipate discharge in the next 24 hours.    Procedures:  Ultrasound-guided paracentesis, date of procedure 11/10/2012. Approximately 7.3 L of clear yellow fluid removed.  Antibiotics:  Clindamycin 450 mg by mouth every 8 hours  HPI/Subjective: Patient today, that he is not at his baseline. He expresses concerns about leaving the hospital and going to the nursing home today. He reports that his lower extremities continue to be more swollen than usual. I administered an additional 60 mg of by mouth  Lasix yesterday evening. He has diuresis of this. Will  monitor him over the course of the day, I anticipate discharge in the next 24 hours.  Objective: Filed Vitals:   11/13/12 0614  BP: 111/56  Pulse: 67  Temp: 98 F (36.7 C)  Resp: 20    Intake/Output Summary (Last 24 hours) at 11/13/12 1039 Last data filed at 11/13/12 0617  Gross per 24 hour  Intake      0 ml  Output    100 ml  Net   -100 ml   Filed Weights   11/11/12 0622 11/12/12 0610 11/13/12 0614  Weight: 128.64 kg (283 lb 9.6 oz) 132.7 kg (292 lb 8.8 oz) 137.1 kg (302 lb 4 oz)    Exam:   General:  Patient is in no acute distress, he is sitting down reading his newspaper  Cardiovascular: Regular rate and rhythm normal S1-S2  Respiratory: Lungs overall clear to auscultation, normal respiratory effort, he does not require supplemental oxygen  Abdomen: Distended, however nontender no palpable masses or rebound tenderness  Musculoskeletal: Preserved range of motion of all extremities  Skin: Erythema noted at dorsum of left foot, to lesser degree dorsum of right foot.   Extremities: 3+ bilateral extremity pitting edema  Data Reviewed: Basic Metabolic Panel:  Recent Labs Lab 11/09/12 1657 11/10/12 0544 11/11/12 0630  NA 135 134* 138  K 4.0 4.0 4.0  CL 99 99 102  CO2 29 27 31   GLUCOSE 77 90 83  BUN 12 11 10   CREATININE 0.89 0.80 0.84  CALCIUM 8.5 8.4 8.7   Liver Function Tests:  Recent Labs Lab 11/09/12 1657  AST 27  ALT 16  ALKPHOS 98  BILITOT 0.7  PROT 6.5  ALBUMIN 2.8*   No results found  for this basename: LIPASE, AMYLASE,  in the last 168 hours  Recent Labs Lab 11/09/12 1657  AMMONIA 71*   CBC:  Recent Labs Lab 11/09/12 1657 11/10/12 0544 11/11/12 0630  WBC 4.7 2.9* 2.4*  NEUTROABS 3.1  --   --   HGB 11.2* 10.2* 10.9*  HCT 34.7* 32.4* 34.6*  MCV 86.8 87.6 88.3  PLT 76* 57* 62*   Cardiac Enzymes: No results found for this basename: CKTOTAL, CKMB, CKMBINDEX, TROPONINI,  in the last 168 hours BNP (last 3 results) No results  found for this basename: PROBNP,  in the last 8760 hours CBG: No results found for this basename: GLUCAP,  in the last 168 hours  Recent Results (from the past 240 hour(s))  BODY FLUID CULTURE     Status: None   Collection Time    11/10/12 10:01 AM      Result Value Range Status   Specimen Description ASCITIC ABDOMEN FLUID   Final   Special Requests 60 ML FLUID   Final   Gram Stain     Final   Value: RARE WBC PRESENT, PREDOMINANTLY MONONUCLEAR     NO ORGANISMS SEEN     Performed at [facility]   Culture     Final   Value: NO GROWTH 2 DAYS     Performed at [facility]   Report Status PENDING   Incomplete     Studies: No results found.  Scheduled Meds: . clindamycin  450 mg Oral Q8H  . ferrous gluconate  325 mg Oral BID WC  . furosemide  60 mg Oral Q breakfast  . lactulose  20 g Oral TID  . levothyroxine  25 mcg Oral QAC breakfast  . pantoprazole  40 mg Oral Daily  . propranolol  80 mg Oral BID  . sertraline  200 mg Oral Daily  . sodium chloride  3 mL Intravenous Q12H  . sodium chloride  3 mL Intravenous Q12H  . spironolactone  125 mg Oral Daily   Continuous Infusions:   Principal Problem:   Ascites Active Problems:   Cirrhosis of liver   Hypothyroidism   Anemia of chronic disease   Cellulitis of left foot    Time spent: 35 minutes    Jeralyn Bennett  [facility] Pager [phone]. If 7PM-7AM, please contact night-coverage at [email or web address], password [facility], [place] 11/13/2012, 10:39 AM  LOS: 4 days

## 2012-11-13 NOTE — Progress Notes (Signed)
Clinical Child psychotherapist (CSW) received report from weekday social worker for patient to D/C today to [facility]. Per RN the MD reported that patient is not medically stable for D/C today and to expect D/C tomorrow 11/14/12. CSW contacted supervisor at [facility] and made him aware of above.   Jetta Lout, LCSWA Weekend CSW [phone]

## 2012-11-14 DIAGNOSIS — G8929 Other chronic pain: Secondary | ICD-10-CM

## 2012-11-14 MED ORDER — CLINDAMYCIN HCL 150 MG PO CAPS: 450.0000 mg | ORAL_CAPSULE | Freq: Three times a day (TID) | ORAL | Status: DC

## 2012-11-14 MED ORDER — OXYCODONE HCL 5 MG PO TABS: 5.0000 mg | ORAL_TABLET | Freq: Four times a day (QID) | ORAL | Status: DC | PRN

## 2012-11-14 MED ORDER — OXYCODONE HCL 5 MG PO TABS: ORAL_TABLET | ORAL | Status: DC

## 2012-11-14 NOTE — Progress Notes (Signed)
Clinical Child psychotherapist (CSW) received call from MD who reported that patient is medically ready for D/C today. CSW contacted Sunday the supervisor at [facility] who reported that everything is ready for the patient to return today. CSW prepared D/C packet and arranged [facility] (non-emergency EMS) to transport patient back to [facility]. Patient reported that he has already made his family aware of above. Nursing is aware of above. Please reconsult if further social work needs arise. CSW signing off.   Jetta Lout, LCSWA Weekend CSW [phone]

## 2012-11-14 NOTE — Progress Notes (Signed)
RN gave report to Manufacturing systems engineer at [facility]. Patient received all morning medication and pain medication prior to discharge. Briefly went over discharge instructions with patient. Patient has no further questions at this time.

## 2012-11-15 ENCOUNTER — Other Ambulatory Visit: Payer: Self-pay | Admitting: *Deleted

## 2012-11-15 MED ORDER — OXYCODONE HCL 5 MG PO TABS: ORAL_TABLET | ORAL | Status: AC

## 2012-11-15 MED ORDER — ZOLPIDEM TARTRATE 5 MG PO TABS: 5.0000 mg | ORAL_TABLET | Freq: Every evening | ORAL | Status: AC | PRN

## 2012-11-16 ENCOUNTER — Non-Acute Institutional Stay (SKILLED_NURSING_FACILITY): Payer: PRIVATE HEALTH INSURANCE | Admitting: Internal Medicine

## 2012-11-16 DIAGNOSIS — L0390 Cellulitis, unspecified: Secondary | ICD-10-CM

## 2012-11-16 DIAGNOSIS — L0291 Cutaneous abscess, unspecified: Secondary | ICD-10-CM

## 2012-11-16 DIAGNOSIS — R609 Edema, unspecified: Secondary | ICD-10-CM

## 2012-11-16 DIAGNOSIS — K7460 Unspecified cirrhosis of liver: Secondary | ICD-10-CM

## 2012-11-16 NOTE — Progress Notes (Deleted)
Patient ID: ZEBULAN HINSHAW, male   DOB: [birth year], 62 y.o.   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 He was admitted with abdominal pain and distention. He had previously had a paracentesis in April 2014. He underwent a therapeutic non-diagnostic abdominal paracentesis again on September 10th. Eleven liters of fluid were removed, followed by albumin infusion. He tolerated this well. He was started on lactulose and Lasix. He was put on sodium restriction. He followed up with GI, who did a repeat large volume paracentesis with 15 L of fluid removed on September 7th. His aldactone was increased to 100 and Lasix 40 mg. apparently he gained weight in the facility after we admitted him last month . He was also noted to have worsening bilateral lower extremity edema. And was felt to have left foot cellulitis. He was treated with clindamycin at 450 every 8 to with gradual improvement of the erythema over the dorsal aspect of the left foot. On this admission he underwent paracentesis on October 8 the. 7.3 L of clear yellow fluid were removed Gram stain of this was negative and his cultures remain negative.  PAST MEDICAL HISTORY/PROBLEM LIST:  Acute-on-chronic ascites secondary to end-stage liver disease, status post paracentesis x 1 on this admission  Liver cirrhosis, likely secondary to chronic alcohol abuse.  Lower extremity edema, status post IV diuretic use.  History of gout.  History of hypertension.  Morbid obesity.  Obstructive sleep apnea.  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  Anemia of chronic disease.  CURRENT MEDICATIONS:  Discharge medications include:   .      Clindamycin 450 every 8 hours.  Lasix 60 a day.   Lactulose 15 cc b.i.d.   Propranolol 80 b.i.d.   Spironolactone 125 daily.   Allopurinol 300 mg daily (presumably gout).   Ferrous sulfate 325 b.i.d.   Synthroid 25 a day.   Omeprazole 20 b.i.d.   Zoloft 200 a day.   Zolpidem 5 mg q.h.s. p.r.n.   Oxycodone 5 mg, 1 tablet twice a day by mouth when necessary  SOCIAL HISTORY: HOUSING: The patient tells me he lives in [place] with family members.  FUNCTIONAL STATUS: He is able to stand and walk.   FAMILY HISTORY:  None available.    REVIEW OF SYSTEMS:   CHEST/RESPIRATORY: Some exertional shortness of breath.  CARDIAC: No chest pain.  GI: His abdomen is somewhat sore, he says, but nausea or vomiting.  GU: No dysuria.  Musculoskeletal complains of low back pain  PHYSICAL EXAMINATION:   GENERAL APPEARANCE: The patient is not in any distress.  HEENT:  MOUTH/THROAT: A few remaining teeth. No lesions.  CHEST/RESPIRATORY: Clear air entry bilaterally.  CARDIOVASCULAR: Heart sounds are normal, JVP is elevated at 45 he has no coccyx edema CARDIAC: Heart sounds are normal. There are no murmurs.  GASTROINTESTINAL:  ABDOMEN:  Massively distended.bowel sounds are reduced. He clearly has some ascites although I wonder whether he has a chronic ileus as well  HERNIA:  He also has a large ventral hernia.    GENITOURINARY:  Not distended.  There is no CVA tenderness.   EDEMA/VARICOSITIES:  he now has very extensive bilateral edema. There is some changes of chronic venous stasis. ICV area of erythema on his foot this will need to be monitored.     ASSESSMENT/PLAN:  End-stage liver disease with cirrhosis.  shine to follow his weights shows hospital weights of 323 pounds on October 8, 283 pounds on October 9 [presumably after his paracentesis] 292 pounds on October 10 and today in the facility he is 310 pounds.suppose to when I last saw this man he clearly needs more diuretics.  He may need to be put on a schedule to return for paracentesis.he is now on a 2 g sodium. 1000 mL fluid restriction   Lower extremity edema.   this seems much more impressive than the last time I saw him especially in the left leg.  Cellulitis of the left foot. I see that he area of erythema here, this will need to be monitored.   Hypothyroidism.  On replacement.     The patient does not appear to be encephalopathic at present.  He has no peripheral edema, although I think he is reaccumulating ascites.  We will check his lab work for next week.    CPT CODE: 16109

## 2012-11-16 NOTE — Progress Notes (Signed)
Patient ID: Shawn Wallace, male   DOB: December 24, 1950, 62 y.o.   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 He was admitted with abdominal pain and distention. He had previously had a paracentesis in April 2014. He underwent a therapeutic non-diagnostic abdominal paracentesis again on September 10th. Eleven liters of fluid were removed, followed by albumin infusion. He tolerated this well. He was started on lactulose and Lasix. He was put on sodium restriction. He followed up with GI, who did a repeat large volume paracentesis with 15 L of fluid removed on September 7th. His aldactone was increased to 100 and Lasix 40 mg.   On this occasion he was admitted to hospital with increasing abdominal girth as well as erythematous foot the. He was felt to have cellulitis and put on clindamycin at 450 by mouth every 8 with improvement the. He also underwent a therapeutic paracentesis removing 7.3 L of fluid. Gram stain and culture of the fluid were negative.  PAST MEDICAL HISTORY/PROBLEM LIST:  Acute-on-chronic ascites secondary to end-stage liver disease, status post paracentesis   Liver cirrhosis, likely secondary to chronic alcohol abuse.  Lower extremity edema, status post IV diuretic use.  History of gout.  History of hypertension.  Morbid obesity.  Obstructive sleep apnea.  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  Anemia of chronic disease.  CURRENT MEDICATIONS:  Discharge medications include:   Lasix 60 a day.   Lactulose 15 cc b.i.d.   Propranolol 80 b.i.d.   Spironolactone 125 daily.   Allopurinol 300 mg daily (presumably gout).   Ferrous sulfate 325 b.i.d.   Synthroid 25 a  day.   Omeprazole 20 b.i.d.   Zoloft 200 a day.   Zolpidem 5 mg q.h.s. p.r.n.   Oxycodone 5 mg by mouth twice a day when necessary .        SOCIAL HISTORY: HOUSING: The patient tells me he lives in [place] with family members.  FUNCTIONAL STATUS: He is able to stand and walk.   FAMILY HISTORY:  None available.    REVIEW OF SYSTEMS:   CHEST/RESPIRATORY: Some exertional shortness of breath.  CARDIAC: No chest pain.  GI: His abdomen is somewhat sore, he says, but nausea or vomiting.  GU: No dysuria.  Musculoskeletal complaining of low back pain.  PHYSICAL EXAMINATION:   GENERAL APPEARANCE: The patient is not in any distress.  HEENT:  MOUTH/THROAT: A few remaining teeth. No lesions.  CHEST/RESPIRATORY: Clear air entry bilaterally.  Breasts he has gynecomastia which showed could very well be from the spirono lactone. An alternative potassium sparing diuretic might be in order CARDIOVASCULAR:  CARDIAC: Heart sounds are normal. There are no murmurs. JVP is elevated GASTROINTESTINAL:  ABDOMEN: Noticeably distended. He probably does have some remaining ascites, although I think this partially might be an ileus. There is no tenderness HERNIA:  He also has a large ventral hernia.    GENITOURINARY:  Not distended.  There is no CVA tenderness.   EDEMA/VARICOSITIES:  Very significant edema is noted with erythema perhaps bruising in his lower legs. The area of erythema on his foot is noticeable still tender Mrs. on the left the.  There is no evidence of a DVT  ASSESSMENT/PLAN:  End-stage liver disease with cirrhosis; as opposed to last time I saw this man he clearly appears to be fluid volume expanded. Following his weights on October 8 he was 323 pounds, on October 9 283 pounds [post paracentesis], on October 10 292 pounds and on return to the facility he is up to 310 pounds again. He is not in an encephalopathic. He clearly needs a more aggressive attempts at diuresis. His BUN and  creatinine were checked in the hospital and were normal  Lower extremity edema.  This occasion he appears to be volume expanded.   Hypothyroidism.  On replacement.     Cellulitis of his foot this will need to be followed.  The patient does not appear to be encephalopathic at present.  He has no peripheral edema, although I think he is reaccumulating ascites.  We will check his lab work for next week.    CPT CODE: 40981

## 2012-11-22 ENCOUNTER — Encounter: Payer: Self-pay | Admitting: Family

## 2012-11-22 ENCOUNTER — Non-Acute Institutional Stay (SKILLED_NURSING_FACILITY): Payer: PRIVATE HEALTH INSURANCE | Admitting: Family

## 2012-11-22 DIAGNOSIS — K769 Liver disease, unspecified: Secondary | ICD-10-CM

## 2012-11-22 DIAGNOSIS — K219 Gastro-esophageal reflux disease without esophagitis: Secondary | ICD-10-CM

## 2012-11-22 DIAGNOSIS — K7460 Unspecified cirrhosis of liver: Secondary | ICD-10-CM

## 2012-11-22 DIAGNOSIS — K7290 Hepatic failure, unspecified without coma: Secondary | ICD-10-CM

## 2012-11-22 DIAGNOSIS — E039 Hypothyroidism, unspecified: Secondary | ICD-10-CM

## 2012-11-22 NOTE — Progress Notes (Signed)
Patient ID: Shawn Wallace, male   DOB: 1951/01/03, 62 y.o.   MRN: [id number] Date: 11/22/12  Facility: [facility]  Code Status:  Full Code   Chief Complaint  Patient presents with  . Discharge Note    HPI: Pt is being followed for medical management of chronic illnesses. He entered facility s/p hospital course for end-stage liver disease. Pt is admitted to nursing facility for short-term rehabilitation.  Rehabilitation course effective and pt is scheduled for d/c 11/22/12. Pt and health care team denies concerns/issues at present       Allergies  Allergen Reactions  . Penicillins Hives     Medication List       This list is accurate as of: 11/22/12  1:37 PM.  Always use your most recent med list.               ferrous gluconate 325 MG tablet  Commonly known as:  FERGON  Take 325 mg by mouth 2 (two) times daily.     furosemide 20 MG tablet  Commonly known as:  LASIX  Take 3 tablets (60 mg total) by mouth daily with breakfast.     lactulose (encephalopathy) 10 GM/15ML Soln  Commonly known as:  CHRONULAC  Take 10 g by mouth 2 (two) times daily.     levothyroxine 25 MCG tablet  Commonly known as:  SYNTHROID, LEVOTHROID  Take 25 mcg by mouth daily before breakfast.     omeprazole 20 MG capsule  Commonly known as:  PRILOSEC  Take 20 mg by mouth 2 (two) times daily.     oxyCODONE 5 MG immediate release tablet  Commonly known as:  Oxy IR/ROXICODONE  Take one tablet by mouth twice daily as needed for pain     propranolol 80 MG tablet  Commonly known as:  INDERAL  Take 80 mg by mouth 2 (two) times daily.     sertraline 100 MG tablet  Commonly known as:  ZOLOFT  Take 200 mg by mouth daily.     spironolactone 25 MG tablet  Commonly known as:  ALDACTONE  Take 5 tablets (125 mg total) by mouth daily.     zolpidem 5 MG tablet  Commonly known as:  AMBIEN  Take 1 tablet (5 mg total) by mouth at bedtime as needed for sleep.         DATA  REVIEWED    Laboratory Studies:  11/15/12-Glucose 130, BUN 11, Creatinine 1.10, Chloride 96, Calcium 8.5    Past Medical History  Diagnosis Date  . Ascites   . End-stage liver disease   . Liver cirrhosis   . Chronic alcohol abuse   . Edema extremities   . Gout   . Hypertension   . Morbid obesity   . Thrombocytopenia   . Anemia   . Shortness of breath     "cause area around stomach full of fluid" (11/09/2012)  . Obstructive sleep apnea     denies using mask on 11/09/2012  . Hypothyroidism   . Daily headache   . Migraine     "~ q 2-3 weeks" (11/09/2012)  . Anxiety   . Depression    Past Surgical History  Procedure Laterality Date  . [place] thora/paracentesis      11 liters  . Knee arthroscopy Right 09/1980  . Patella fracture surgery Left 06/2011    "blister under kneecap; had to open it up to get the jagged edges off" (11/09/2012)  . Laparoscopic gastric banding  ?08/2005    "  for stomach ulcers" (11/09/2012)  . Humerus fracture surgery Left 1972    "compound fracture" (11/09/2012)    History   Social History  . Marital Status: Married    Spouse Name: N/A    Number of Children: N/A  . Years of Education: N/A   Occupational History  . Not on file.   Social History Main Topics  . Smoking status: Former Smoker -- 0.25 packs/day for 43 years    Types: Cigarettes    Quit date: 10/12/2012  . Smokeless tobacco: Never Used  . Alcohol Use: Yes     Comment: 11/09/2012 "was considered an alcoholic; drank 12 beers/day; quit 7 yr ago"  . Drug Use: No  . Sexual Activity: Not Currently   Other Topics Concern  . Not on file   Social History Narrative  . No narrative on file    Review of Systems  HENT: Negative.   Respiratory: Negative.   Cardiovascular: Positive for leg swelling.  Gastrointestinal: Positive for abdominal pain.       Ascites/Liver Cirrhosis S/p Paracentesis  Skin: Negative.   Neurological: Negative.   Endo/Heme/Allergies: Negative.    Psychiatric/Behavioral: Negative.      Physical Exam Filed Vitals:   11/22/12 1316  BP: 142/72  Pulse: 60  Temp: 97.7 F (36.5 C)  Resp: 18   There is no weight on file to calculate BMI. Physical Exam  Constitutional: He is oriented to person, place, and time.  HENT:  Mouth/Throat: Oropharynx is clear and moist. Abnormal dentition.  Eyes: Conjunctivae are normal.  Neck: Normal range of motion. Neck supple.  Cardiovascular: Normal rate and regular rhythm.   BLE edema 2+  Pulmonary/Chest: Effort normal and breath sounds normal.  Abdominal: Bowel sounds are normal. He exhibits distension.  Musculoskeletal: Normal range of motion.  Lymphadenopathy:    He has no cervical adenopathy.    He has no axillary adenopathy.  Neurological: He is alert and oriented to person, place, and time.    ASSESSMENT/PLAN  Pt d/c with SN for medication management, PT for mobility and strength training, OT for ADL's Pt advised to f/u with PCP, appt scheduled for 11/25/12 Pt advised to f/u with emergency services (911) for acute/emergent concerns Pt advised to f/u with GI, appt scheduled for November Education provided regarding safety precautions and medication management Education provided regarding measure weight- advised to obtain floor scale Prescriptions provided for pt

## 2012-11-24 DIAGNOSIS — R109 Unspecified abdominal pain: Secondary | ICD-10-CM | POA: Insufficient documentation

## 2012-11-24 NOTE — Progress Notes (Signed)
Patient ID: Shawn Wallace, male   DOB: [birth year], 62 y.o.   MRN: [id number]        PROGRESS NOTE  DATE: 10/25/2012  FACILITY:  [facility]  LEVEL OF CARE: SNF (31)  Acute Visit  CHIEF COMPLAINT:  Manage abdominal pain.    HISTORY OF PRESENT ILLNESS: I was requested by the staff to assess the patient regarding above problem(s):  The patient is complaining of uncontrolled abdominal pain.   He is currently on oxycodone 5 mg p.r.n. and says this is not sufficient.   He has end-stage liver disease and feels this is secondary to alcohol abuse.    PAST MEDICAL HISTORY : Reviewed.  No changes.  CURRENT MEDICATIONS: Reviewed per [facility]  REVIEW OF SYSTEMS:  GENERAL: no change in appetite, no fatigue, no weight changes, no fever, chills or weakness RESPIRATORY: no cough, SOB, DOE,, wheezing, hemoptysis CARDIAC: no chest pain or palpitations;  chronic lower extremity swelling   GI: chronic abdominal pain; no diarrhea, constipation, heart burn, nausea or vomiting  PHYSICAL EXAMINATION  VS:  T 98.4       P 72      RR 16      BP 112/64     POX %       WT (Lb)  GENERAL: no acute distress, morbidly obese body habitus NECK: supple, trachea midline, no neck masses, no thyroid tenderness, no thyromegaly RESPIRATORY: breathing is even & unlabored, BS CTAB CARDIAC: RRR, no murmur,no extra heart sounds EDEMA/VARICOSITIES:  +2 bilateral lower extremity edema  ARTERIAL:  pedal pulses nonpalpable   GI: abdomen distended and diffusely tender to palpation, no bowel sounds, no masses, no tenderness, no hepatomegaly, no splenomegaly  PSYCHIATRIC: the patient is alert & oriented to person, affect & behavior appropriate  ASSESSMENT/PLAN:  Chronic abdominal pain.  Uncontrolled problem.  Increase oxycodone to 5 mg b.i.d. p.r.n.    CPT CODE: 40981

## 2013-11-30 ENCOUNTER — Other Ambulatory Visit: Payer: Self-pay | Admitting: *Deleted

## 2013-11-30 MED ORDER — TRAMADOL HCL 50 MG PO TABS: ORAL_TABLET | ORAL | Status: DC

## 2013-11-30 NOTE — Telephone Encounter (Signed)
[facility] 

## 2013-12-01 ENCOUNTER — Other Ambulatory Visit: Payer: Self-pay | Admitting: *Deleted

## 2013-12-01 MED ORDER — TRAMADOL HCL 50 MG PO TABS: ORAL_TABLET | ORAL | Status: AC

## 2013-12-01 NOTE — Telephone Encounter (Signed)
[facility] 

## 2013-12-02 ENCOUNTER — Non-Acute Institutional Stay (SKILLED_NURSING_FACILITY): Payer: PRIVATE HEALTH INSURANCE | Admitting: Internal Medicine

## 2013-12-02 DIAGNOSIS — D638 Anemia in other chronic diseases classified elsewhere: Secondary | ICD-10-CM

## 2013-12-02 DIAGNOSIS — K7031 Alcoholic cirrhosis of liver with ascites: Secondary | ICD-10-CM

## 2013-12-02 DIAGNOSIS — M5431 Sciatica, right side: Secondary | ICD-10-CM

## 2013-12-02 DIAGNOSIS — I10 Essential (primary) hypertension: Secondary | ICD-10-CM

## 2013-12-02 NOTE — Progress Notes (Signed)
Patient ID: Shawn BrunswickRonald W Wallace, male   DOB: 05/30/1950, 63 y.o.   MRN: [id number]  Facility; [facility] Chief complaint; [facility] from uncertain date to 10/28  History ; this is a patient we actually know from a stay in the building in 2014. He has end-stage cirrhosis with refractory ascites, portal hypertension, pancytopenia. He has frequent paracentesis.  On this occasion [facility] with refractory ascites and sciatica on the right side. He underwent a 5 L paracentesis. He is apparently already scheduled for recurrent paracentesis next week. He states the major reason he was hospitalized was actually the pain in his back that radiates into his right hip area.  Problem list #1 cirrhosis of the liver presumably alcohol status post TIPS procedure and recurrent paracentesis, portal hypertension #2 listed as having "sciatica" the exact nature of this is unclear presumably degenerative #3 obstructive sleep apnea #4 essential hypertension #5 insomnia 6 thrombocytopenia presumably secondary to portal hypertension #6 migraine #7 atrial fibrillation #8 anemia #9 history of peptic ulcer disease #10 gout #11 esophageal varices #11 tobacco abuse #12 history of group A strep bacteremia of unclear etiology  Current medications  allopurinol 300 daily Diltiazem CD 120 daily Eucerin cream Lactulose 45 ML's 3 times a day  Synthroid 25 g daily  omeprazole 20 twice a day  Zoloft 50 mg every morning  Aldactone 50 mg daily Lasix 40 mg daily Tramadol 50 mg every 6 when necessary  Social; he does not come with any advanced directives that I can see. States [place]. Claims to be functionally independent  Review of systems Musculoskeletal; states his major issue currently is difficulty walking due to his "sciatica" pain seems to radiate into his right hip that. I don't really see  what was the nature of this or imaging studies. He had a TENS unit applied Cardiac no chest pain or palpitations Respiratory no shortness of breath GI no abdominal pain  Physical examination Gen. patient is not in any distress. Seems to be quite clear in his conversation Respiratory shallow but clear air entry Cardiac heart sounds seem regular. JVP is not elevated. He has no coccyx edema does have very significant lower extremity edema Abdomen; massively distended compatible with ascites. He has no asterixis  Impression/plan #1 end-stage liver disease/cirrhosis. I have in my previous notes this was secondary to alcohol although can't verify this at the moment. He apparently has recurrent and [facility]. I'll have the staff make sure that he can be transported. He does not appear to have asterixis. He is on lactulose. He is on Lasix and Aldactone although it reasonably small doses. On October 27 his sodium was 137 potassium 3.9 total CO2 was 28. BUN is 17 and creatinine at 1.01 #2 atrial fibrillation rate is controlled. He is not on anything for stroke prophylaxis #3 anemia. Discharge hemoglobin seems still been at 7.8. As there are a multitude of possible causes of anemia and people with end-stage liver disease it is difficult to comment on this. 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 Listed in the records as "sciatica". I am not planning to work this up further at the moment.  The patient appears to be stable  currently. He states [facility] all of the facility look into this

## 2013-12-25 IMAGING — US US PARACENTESIS
1 series · 4 of 4 positions shown · non-contrast
Comparison: None

CLINICAL DATA: Alcoholic liver disease, cirrhosis, ascites.
Request for diagnostic and therapeutic paracentesis.

ULTRASOUND GUIDED PARACENTESIS

[Series 1: us paracentesis · 0.27mm/px · 4 of 4 slices shown]
[im 1/4]
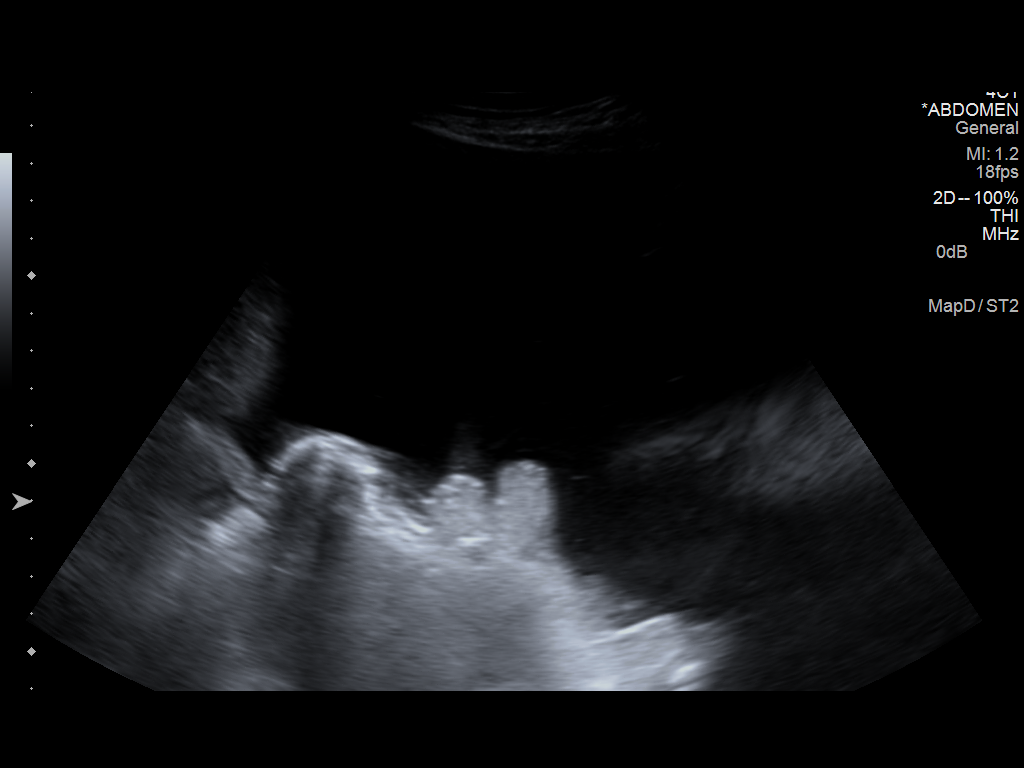
[im 2/4]
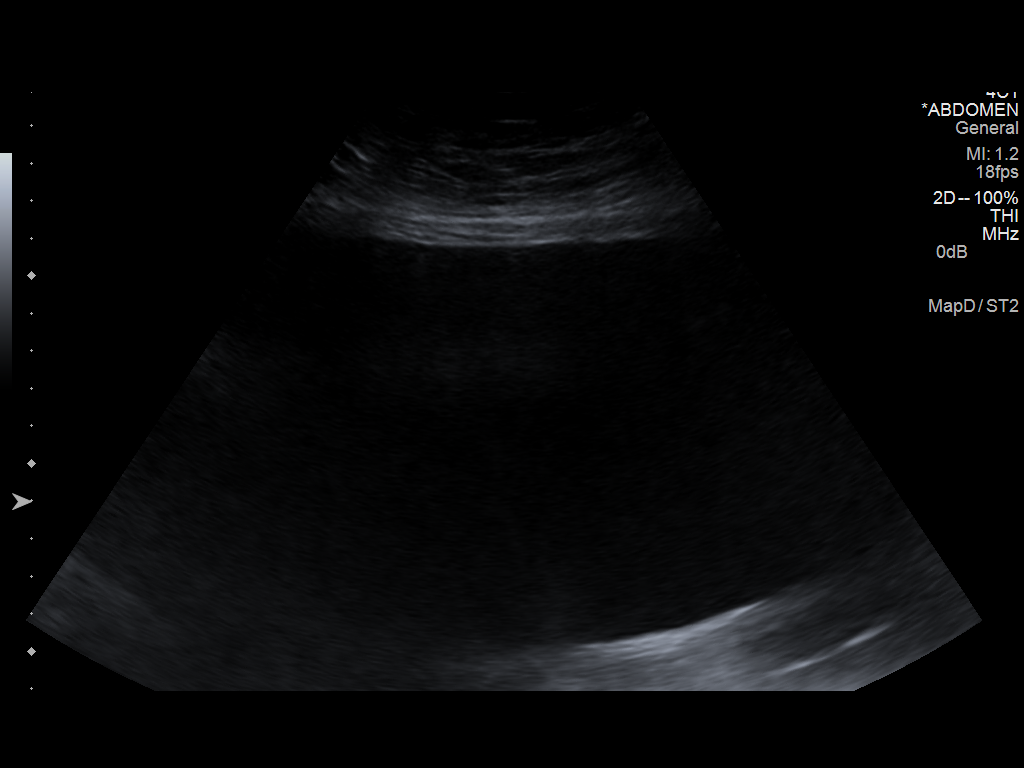
[im 3/4]
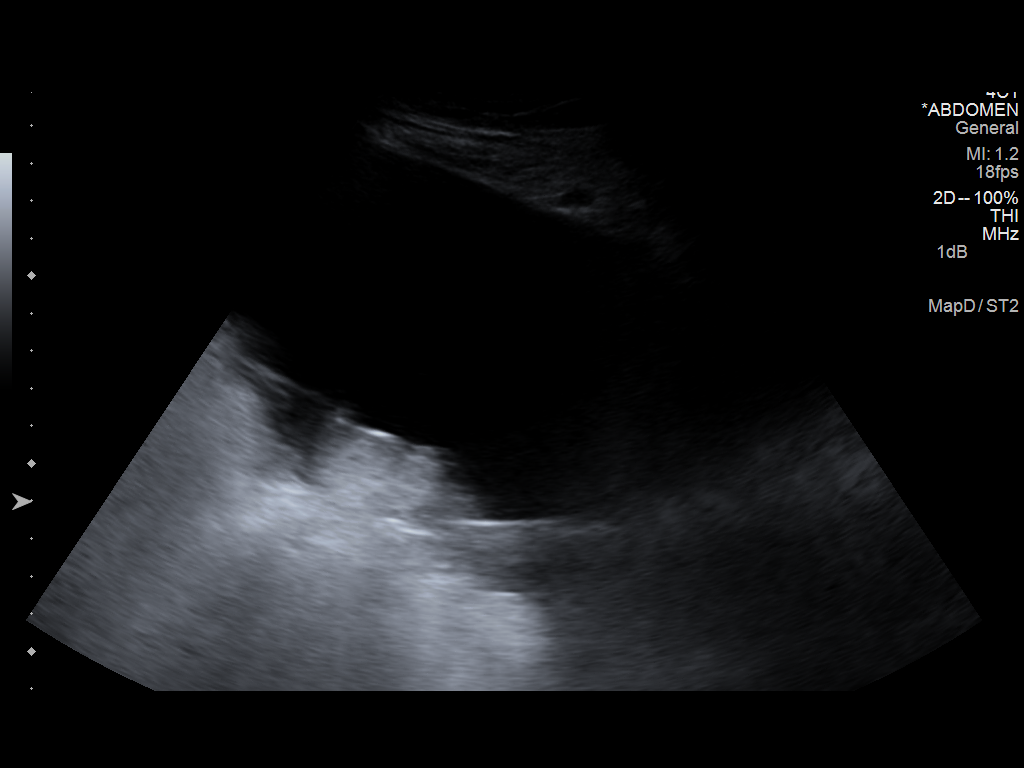
[im 4/4]
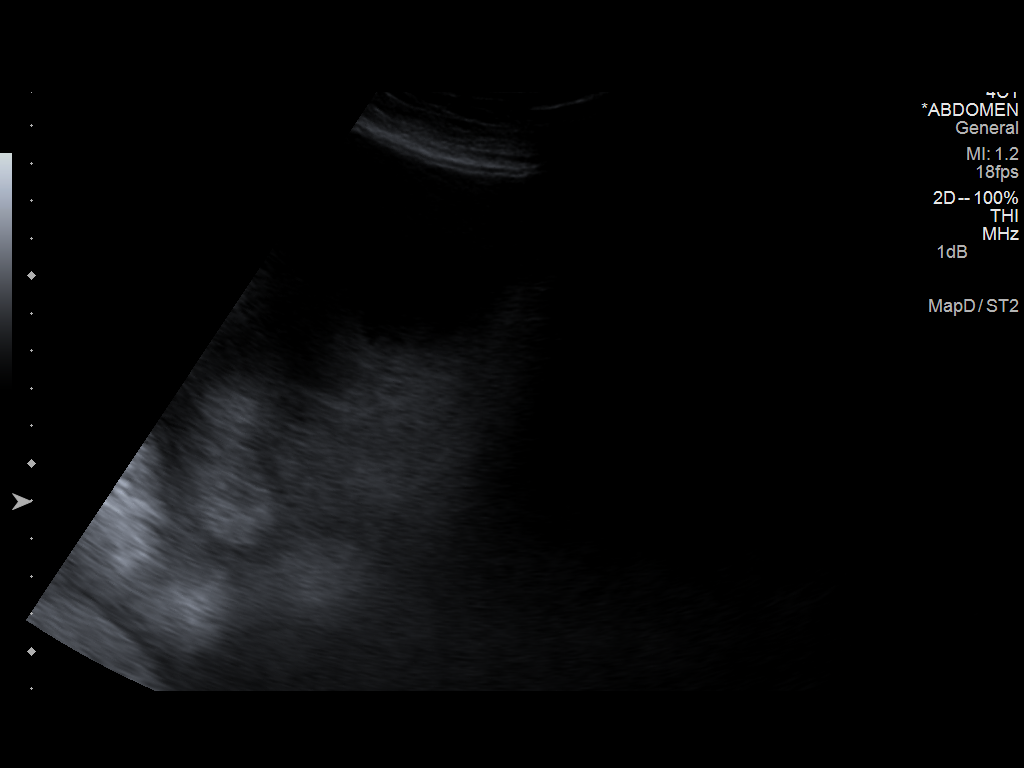

[4 of 4 positions shown; findings below may reference images not displayed]

An ultrasound guided paracentesis was thoroughly discussed with the
patient and questions answered.  The benefits, risks, alternatives
and complications were also discussed.  The patient understands and
wishes to proceed with the procedure.  Written consent was
obtained.

Ultrasound was performed to localize and mark an adequate pocket of
fluid in the right lower quadrant of the abdomen.  The area was
then prepped and draped in the normal sterile fashion.  1%
Lidocaine was used for local anesthesia.  Under ultrasound guidance
a 19 gauge Yueh catheter was introduced.  Paracentesis was
performed.  The catheter was removed and a dressing applied.

Complications:  None
FINDINGS: A total of approximately 7.3 liters of clear yellow
fluid was removed.  A fluid sample was sent for laboratory
analysis.
IMPRESSION: Successful ultrasound guided paracentesis yielding 7.3 liters of
ascites.

Read by: Jungudo, Harira,-KATTIA

## 2014-06-04 DEATH — deceased
